# Patient Record
Sex: Female | Born: 2000
Health system: Southern US, Community
[De-identification: ages and names within clinical notes are randomized; demographics above are authoritative.]

## PROBLEM LIST (undated history)

## (undated) DIAGNOSIS — J45909 Unspecified asthma, uncomplicated: Secondary | ICD-10-CM

## (undated) DIAGNOSIS — N390 Urinary tract infection, site not specified: Secondary | ICD-10-CM

## (undated) DIAGNOSIS — N200 Calculus of kidney: Secondary | ICD-10-CM

## (undated) DIAGNOSIS — N12 Tubulo-interstitial nephritis, not specified as acute or chronic: Secondary | ICD-10-CM

## (undated) DIAGNOSIS — F419 Anxiety disorder, unspecified: Secondary | ICD-10-CM

## (undated) DIAGNOSIS — D580 Hereditary spherocytosis: Secondary | ICD-10-CM

## (undated) DIAGNOSIS — K8020 Calculus of gallbladder without cholecystitis without obstruction: Secondary | ICD-10-CM

## (undated) HISTORY — PX: TONSILLECTOMY: SUR1361

## (undated) HISTORY — PX: TYMPANOSTOMY TUBE PLACEMENT: SHX32

## (undated) HISTORY — DX: Calculus of gallbladder without cholecystitis without obstruction: K80.20

## (undated) HISTORY — PX: CHOLECYSTECTOMY: SHX55

## (undated) HISTORY — DX: Anxiety disorder, unspecified: F41.9

## (undated) HISTORY — DX: Unspecified asthma, uncomplicated: J45.909

## (undated) HISTORY — PX: SPLENECTOMY, TOTAL: SHX788

---
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
## 2022-01-04 ENCOUNTER — Encounter: Payer: Self-pay | Admitting: Gastroenterology

## 2022-03-22 ENCOUNTER — Other Ambulatory Visit: Payer: Self-pay | Admitting: Family Medicine

## 2022-03-22 DIAGNOSIS — M25561 Pain in right knee: Secondary | ICD-10-CM

## 2022-04-04 ENCOUNTER — Ambulatory Visit
Admission: RE | Admit: 2022-04-04 | Discharge: 2022-04-04 | Disposition: A | Discharge status: Home / Self Care | Payer: 59 | Source: Ambulatory Visit | Attending: Family Medicine | Admitting: Family Medicine

## 2022-04-04 DIAGNOSIS — M25561 Pain in right knee: Secondary | ICD-10-CM

## 2022-05-07 ENCOUNTER — Ambulatory Visit
Admission: RE | Admit: 2022-05-07 | Discharge: 2022-05-07 | Disposition: A | Discharge status: Home / Self Care | Payer: 59 | Source: Ambulatory Visit | Attending: Family Medicine | Admitting: Family Medicine

## 2022-05-07 ENCOUNTER — Other Ambulatory Visit: Payer: Self-pay | Admitting: Family Medicine

## 2022-05-07 DIAGNOSIS — R109 Unspecified abdominal pain: Secondary | ICD-10-CM

## 2022-05-07 DIAGNOSIS — R300 Dysuria: Secondary | ICD-10-CM

## 2022-05-07 DIAGNOSIS — N200 Calculus of kidney: Secondary | ICD-10-CM

## 2022-05-10 ENCOUNTER — Inpatient Hospital Stay (HOSPITAL_BASED_OUTPATIENT_CLINIC_OR_DEPARTMENT_OTHER)
Admission: EM | Admit: 2022-05-10 | Discharge: 2022-05-12 | DRG: 872 | Disposition: A | Discharge status: Home / Self Care | Payer: 59 | Attending: Internal Medicine | Admitting: Internal Medicine

## 2022-05-10 ENCOUNTER — Encounter (HOSPITAL_BASED_OUTPATIENT_CLINIC_OR_DEPARTMENT_OTHER): Payer: Self-pay

## 2022-05-10 ENCOUNTER — Emergency Department (HOSPITAL_BASED_OUTPATIENT_CLINIC_OR_DEPARTMENT_OTHER): Payer: 59

## 2022-05-10 ENCOUNTER — Other Ambulatory Visit: Payer: Self-pay

## 2022-05-10 DIAGNOSIS — D580 Hereditary spherocytosis: Secondary | ICD-10-CM | POA: Diagnosis present

## 2022-05-10 DIAGNOSIS — Z79899 Other long term (current) drug therapy: Secondary | ICD-10-CM | POA: Diagnosis not present

## 2022-05-10 DIAGNOSIS — Z87442 Personal history of urinary calculi: Secondary | ICD-10-CM | POA: Diagnosis not present

## 2022-05-10 DIAGNOSIS — Z808 Family history of malignant neoplasm of other organs or systems: Secondary | ICD-10-CM

## 2022-05-10 DIAGNOSIS — Z1629 Resistance to other single specified antibiotic: Secondary | ICD-10-CM | POA: Diagnosis present

## 2022-05-10 DIAGNOSIS — R17 Unspecified jaundice: Secondary | ICD-10-CM | POA: Diagnosis present

## 2022-05-10 DIAGNOSIS — Z9081 Acquired absence of spleen: Secondary | ICD-10-CM | POA: Diagnosis not present

## 2022-05-10 DIAGNOSIS — E871 Hypo-osmolality and hyponatremia: Secondary | ICD-10-CM | POA: Diagnosis not present

## 2022-05-10 DIAGNOSIS — K219 Gastro-esophageal reflux disease without esophagitis: Secondary | ICD-10-CM | POA: Diagnosis present

## 2022-05-10 DIAGNOSIS — Z833 Family history of diabetes mellitus: Secondary | ICD-10-CM | POA: Diagnosis not present

## 2022-05-10 DIAGNOSIS — A4151 Sepsis due to Escherichia coli [E. coli]: Secondary | ICD-10-CM | POA: Diagnosis not present

## 2022-05-10 DIAGNOSIS — Z825 Family history of asthma and other chronic lower respiratory diseases: Secondary | ICD-10-CM

## 2022-05-10 DIAGNOSIS — D75839 Thrombocytosis, unspecified: Secondary | ICD-10-CM | POA: Diagnosis not present

## 2022-05-10 DIAGNOSIS — J45909 Unspecified asthma, uncomplicated: Secondary | ICD-10-CM | POA: Diagnosis not present

## 2022-05-10 DIAGNOSIS — E8809 Other disorders of plasma-protein metabolism, not elsewhere classified: Secondary | ICD-10-CM | POA: Diagnosis present

## 2022-05-10 DIAGNOSIS — Z9049 Acquired absence of other specified parts of digestive tract: Secondary | ICD-10-CM | POA: Diagnosis not present

## 2022-05-10 DIAGNOSIS — Z8249 Family history of ischemic heart disease and other diseases of the circulatory system: Secondary | ICD-10-CM | POA: Diagnosis not present

## 2022-05-10 DIAGNOSIS — N10 Acute tubulo-interstitial nephritis: Secondary | ICD-10-CM | POA: Diagnosis present

## 2022-05-10 DIAGNOSIS — E861 Hypovolemia: Secondary | ICD-10-CM | POA: Diagnosis not present

## 2022-05-10 DIAGNOSIS — R319 Hematuria, unspecified: Secondary | ICD-10-CM | POA: Diagnosis present

## 2022-05-10 DIAGNOSIS — E876 Hypokalemia: Secondary | ICD-10-CM | POA: Diagnosis present

## 2022-05-10 DIAGNOSIS — Z885 Allergy status to narcotic agent status: Secondary | ICD-10-CM | POA: Diagnosis not present

## 2022-05-10 DIAGNOSIS — Z803 Family history of malignant neoplasm of breast: Secondary | ICD-10-CM

## 2022-05-10 LAB — URINALYSIS, ROUTINE W REFLEX MICROSCOPIC
Bilirubin Urine: NEGATIVE
Glucose, UA: NEGATIVE mg/dL
Ketones, ur: 40 mg/dL — AB
Nitrite: POSITIVE — AB
Protein, ur: 100 mg/dL — AB
Specific Gravity, Urine: 1.017 (ref 1.005–1.030)
WBC, UA: >50 WBC/hpf — ABNORMAL HIGH (ref 0–5)
pH: 6 (ref 5.0–8.0)

## 2022-05-10 LAB — DIFFERENTIAL
Abs Immature Granulocytes: 0.15 10*3/uL — ABNORMAL HIGH (ref 0.00–0.07)
Basophils Absolute: 0.1 10*3/uL (ref 0.0–0.1)
Basophils Relative: 0 %
Eosinophils Absolute: 0 10*3/uL (ref 0.0–0.5)
Eosinophils Relative: 0 %
Immature Granulocytes: 1 %
Lymphocytes Relative: 5 %
Lymphs Abs: 1.3 10*3/uL (ref 0.7–4.0)
Monocytes Absolute: 2.9 10*3/uL — ABNORMAL HIGH (ref 0.1–1.0)
Monocytes Relative: 11 %
Neutro Abs: 22.1 10*3/uL — ABNORMAL HIGH (ref 1.7–7.7)
Neutrophils Relative %: 83 %
Other: INCREASED %

## 2022-05-10 LAB — CBC
HCT: 38.4 % (ref 36.0–46.0)
Hemoglobin: 14 g/dL (ref 12.0–15.0)
MCH: 32.7 pg (ref 26.0–34.0)
MCHC: 36.5 g/dL — ABNORMAL HIGH (ref 30.0–36.0)
MCV: 89.7 fL (ref 80.0–100.0)
Platelets: 485 10*3/uL — ABNORMAL HIGH (ref 150–400)
RBC: 4.28 MIL/uL (ref 3.87–5.11)
RDW: 12.1 % (ref 11.5–15.5)
WBC: 26 10*3/uL — ABNORMAL HIGH (ref 4.0–10.5)
nRBC: 0 % (ref 0.0–0.2)

## 2022-05-10 LAB — BASIC METABOLIC PANEL
Anion gap: 11 (ref 5–15)
BUN: 11 mg/dL (ref 6–20)
CO2: 24 mmol/L (ref 22–32)
Calcium: 10 mg/dL (ref 8.9–10.3)
Chloride: 96 mmol/L — ABNORMAL LOW (ref 98–111)
Creatinine, Ser: 0.9 mg/dL (ref 0.44–1.00)
GFR, Estimated: >60 mL/min (ref >60)
Glucose, Bld: 88 mg/dL (ref 70–99)
Potassium: 3.2 mmol/L — ABNORMAL LOW (ref 3.5–5.1)
Sodium: 131 mmol/L — ABNORMAL LOW (ref 135–145)

## 2022-05-10 LAB — PREGNANCY, URINE: Preg Test, Ur: NEGATIVE

## 2022-05-10 LAB — LACTIC ACID, PLASMA: Lactic Acid, Venous: 0.7 mmol/L (ref 0.5–1.9)

## 2022-05-10 MED ORDER — POTASSIUM CHLORIDE IN NACL 40-0.9 MEQ/L-% IV SOLN
INTRAVENOUS | Status: DC
  Filled 2022-05-10: qty 1000

## 2022-05-10 MED ORDER — SODIUM CHLORIDE 0.9 % IV SOLN
1.0000 g | Freq: Once | INTRAVENOUS | Status: AC
  Administered 2022-05-10: 1 g via INTRAVENOUS
  Filled 2022-05-10: qty 10

## 2022-05-10 MED ORDER — LACTATED RINGERS IV BOLUS
1000.0000 mL | Freq: Once | INTRAVENOUS | Status: AC
  Administered 2022-05-10: 1000 mL via INTRAVENOUS

## 2022-05-10 MED ORDER — MELATONIN 3 MG PO TABS
3.0000 mg | ORAL_TABLET | Freq: Every evening | ORAL | Status: DC | PRN
Start: 2022-05-10 — End: 2022-05-12

## 2022-05-10 MED ORDER — ENOXAPARIN SODIUM 40 MG/0.4ML IJ SOSY
40.0000 mg | PREFILLED_SYRINGE | Freq: Every day | INTRAMUSCULAR | Status: DC
  Filled 2022-05-10: qty 0.4

## 2022-05-10 MED ORDER — HYDROMORPHONE HCL 1 MG/ML IJ SOLN: 0.5000 mg | INTRAMUSCULAR | Status: DC | PRN

## 2022-05-10 MED ORDER — SODIUM CHLORIDE 0.9 % IV SOLN
2.0000 g | INTRAVENOUS | Status: DC
  Administered 2022-05-11 – 2022-05-12 (×2): 2 g via INTRAVENOUS
  Filled 2022-05-10 (×2): qty 20

## 2022-05-10 MED ORDER — POLYETHYLENE GLYCOL 3350 17 G PO PACK
17.0000 g | PACK | Freq: Every day | ORAL | Status: DC | PRN
  Administered 2022-05-11: 17 g via ORAL
  Filled 2022-05-10: qty 1

## 2022-05-10 MED ORDER — ACETAMINOPHEN 500 MG PO TABS
1000.0000 mg | ORAL_TABLET | Freq: Once | ORAL | Status: AC
  Administered 2022-05-10: 1000 mg via ORAL
  Filled 2022-05-10: qty 2

## 2022-05-10 MED ORDER — PROCHLORPERAZINE EDISYLATE 10 MG/2ML IJ SOLN
10.0000 mg | Freq: Four times a day (QID) | INTRAMUSCULAR | Status: DC | PRN
  Administered 2022-05-10 – 2022-05-11 (×2): 10 mg via INTRAVENOUS
  Filled 2022-05-10 (×2): qty 2

## 2022-05-10 MED ORDER — KETOROLAC TROMETHAMINE 15 MG/ML IJ SOLN
15.0000 mg | Freq: Four times a day (QID) | INTRAMUSCULAR | Status: AC | PRN
  Administered 2022-05-10 – 2022-05-11 (×3): 15 mg via INTRAVENOUS
  Filled 2022-05-10 (×3): qty 1

## 2022-05-10 MED ORDER — ACETAMINOPHEN 325 MG PO TABS
650.0000 mg | ORAL_TABLET | Freq: Four times a day (QID) | ORAL | Status: DC | PRN
  Administered 2022-05-10 – 2022-05-11 (×2): 650 mg via ORAL
  Filled 2022-05-10 (×2): qty 2

## 2022-05-10 MED ORDER — OXYCODONE HCL 5 MG PO TABS: 5.0000 mg | ORAL_TABLET | Freq: Four times a day (QID) | ORAL | Status: DC | PRN

## 2022-05-10 MED ORDER — ACETAMINOPHEN 500 MG PO TABS
1000.0000 mg | ORAL_TABLET | Freq: Four times a day (QID) | ORAL | Status: DC | PRN
  Filled 2022-05-10: qty 2

## 2022-05-10 MED ORDER — SENNOSIDES-DOCUSATE SODIUM 8.6-50 MG PO TABS
2.0000 | ORAL_TABLET | Freq: Every day | ORAL | Status: DC
  Filled 2022-05-10: qty 2

## 2022-05-10 MED ORDER — ONDANSETRON HCL 4 MG/2ML IJ SOLN
4.0000 mg | Freq: Once | INTRAMUSCULAR | Status: AC
  Administered 2022-05-10: 4 mg via INTRAVENOUS
  Filled 2022-05-10: qty 2

## 2022-05-10 MED ORDER — KETOROLAC TROMETHAMINE 15 MG/ML IJ SOLN: 15.0000 mg | Freq: Four times a day (QID) | INTRAMUSCULAR | Status: DC | PRN

## 2022-05-10 MED ORDER — KETOROLAC TROMETHAMINE 30 MG/ML IJ SOLN
30.0000 mg | Freq: Once | INTRAMUSCULAR | Status: AC
  Administered 2022-05-10: 30 mg via INTRAVENOUS
  Filled 2022-05-10: qty 1

## 2022-05-10 NOTE — ED Triage Notes (Signed)
Patient here POV from Home.  Endorses having Renal Stones that were present Friday and were passed Saturday. Seen by PCP on Monday and had CT Scan completed which displayed Inflammation of Ureters.   Fever Intermittently. Bilateral Flank Pain. Moderate Nausea. No Emesis. No Diarrhea. Moderate Constipation. 102 Fever today. Tylenol 2 Hours PTA.  Originally finished Bactrim Course and has been taking Levaquin for 2 Days.   NAD Noted during Triage. A&Ox4. GCS 15. Ambulatory.

## 2022-05-10 NOTE — ED Notes (Signed)
Retook pt's temp. Temp was 102.0. Pt also stated that she is in pain. Informed Dr. Dalene Seltzer.

## 2022-05-10 NOTE — ED Notes (Signed)
US at bedside

## 2022-05-10 NOTE — H&P (Addendum)
History and Physical  Andrea Gaines IRJ:[id number] DOB: May 08, 2001 DOA: 05/10/2022  Referring physician: Accepted as a direct admit by Dr. Hanley Ben, [facility], hospitalist service. PCP: Lavada Mesi, MD  Outpatient Specialists: None. Patient coming from: Home.  Chief Complaint: Hematuria.  HPI: Andrea Gaines is a 21 y.o. female with medical history significant for GERD, prior splenectomy, who presented to [facility] ED with complaints of hematuria and bilateral flank pain with concern for bilateral pyelonephritis.  The patient was recently treated outpatient for urinary tract infection and recently passed 3 renal stones the weekend of 6/23-6/25.  She had a noncontrast CT scan done on 05/07/2022 which showed mild dilatation of the right renal pelvis without obstructing stone identified.  This could be due to recently passed stone.    Upon presentation to [facility] ED, work-up revealed UA positive for pyuria, leukocytosis 26 K and tachycardia 109.  Cultures were obtained and the patient was started on empiric IV antibiotics Rocephin in the ED.  Additionally received 1 L IV fluid bolus LR x1, IV antiemetics Zofran 4 mg x 1, 30 mg IV Toradol x1 and 1 g p.o. Tylenol x1.  [facility], hospitalist service, was asked to admit.  The patient was accepted and admitted by Dr. Hanley Ben, [facility], hospitalist service, as observation status to [facility] long hospital telemetry unit.  At the time of this visit, the patient reports no recurrence of hematuria the last 2 times she voided.    Renal ultrasound done on 05/10/2022 was nonacute and benign.  No evidence of hydronephrosis or masses.  Lactic acid negative at 0.7.  ED Course: Tmax 99.2.  BP 104/50, pulse 72, respiratory rate 19, O2 saturation 98% on room air.  Lab studies remarkable for WBC 26,000, platelet count 45, neutrophil count 22.1.  Serum sodium 131, potassium 3.2, chloride 96.  Lactic acid 0.7.  Review of Systems: Review of systems as noted in the HPI. All other  systems reviewed and are negative.   Past Medical History:  Diagnosis Date   Anxiety    Asthma    Gallstones    Hereditary spherocytosis (HCC)    Past Surgical History:  Procedure Laterality Date   CHOLECYSTECTOMY     SPLENECTOMY, TOTAL     TONSILLECTOMY     TYMPANOSTOMY TUBE PLACEMENT      Social History:  reports that she has never smoked. She has never used smokeless tobacco. She reports current alcohol use. She reports that she does not use drugs.   Allergies  Allergen Reactions   Morphine Palpitations    Made Hot and pains in back   Augmentin [Amoxicillin-Pot Clavulanate] Diarrhea    Family History  Problem Relation Age of Onset   Hypertension Mother    Hereditary spherocytosis Mother    Hiatal hernia Mother    Hereditary spherocytosis Sister    Asthma Sister    Asthma Brother    Kidney Stones Brother    Heart disease Maternal Grandmother    Aneurysm Maternal Grandmother        brain   Diabetes Maternal Grandmother    Hypertension Maternal Grandmother    Heart disease Maternal Grandfather    Hypertension Maternal Grandfather    Brain cancer Paternal Grandmother    Prostate cancer Paternal Grandfather    Testicular cancer Maternal Uncle    Breast cancer Other        Maternal great aunt      Prior to Admission medications   Medication Sig Start Date End Date Taking? Authorizing Provider  CARAFATE 1 g tablet Take 1 g by mouth 4 (four) times daily. 12/14/21   [provider]  celecoxib (CELEBREX) 100 MG capsule Take 100 mg by mouth daily.    [provider]  multivitamin (VIT W/EXTRA C) CHEW chewable tablet Chew 1 tablet by mouth daily.    [provider]  TRI-LO-ESTARYLLA 0.18/0.215/0.25 MG-25 MCG tab Take 1 tablet by mouth daily. 11/27/21   [provider]    Physical Exam: BP (!) 104/50 (BP Location: Right Arm)   Pulse 72   Temp 98.4 F (36.9 C) (Oral)   Resp 19   Ht 5' 7.75" (1.721 m)   Wt 74.4 kg   SpO2 98%    BMI 25.12 kg/m   General: 21 y.o. year-old female well developed well nourished in no acute distress.  Alert and oriented x3. Cardiovascular: Regular rate and rhythm with no rubs or gallops.  No thyromegaly or JVD noted.  No lower extremity edema. 2/4 pulses in all 4 extremities. Respiratory: Clear to auscultation with no wheezes or rales. Good inspiratory effort. Abdomen: Soft nontender nondistended with normal bowel sounds x4 quadrants.  Bilateral flank pain. Muskuloskeletal: No cyanosis, clubbing or edema noted bilaterally Neuro: CN II-XII intact, strength, sensation, reflexes Skin: No ulcerative lesions noted or rashes Psychiatry: Judgement and insight appear normal. Mood is appropriate for condition and setting          Labs on Admission:  Basic Metabolic Panel: Recent Labs  Lab 05/10/22 1441  NA 131*  K 3.2*  CL 96*  CO2 24  GLUCOSE 88  BUN 11  CREATININE 0.90  CALCIUM 10.0   Liver Function Tests: No results for input(s): "AST", "ALT", "ALKPHOS", "BILITOT", "PROT", "ALBUMIN" in the last 168 hours. No results for input(s): "LIPASE", "AMYLASE" in the last 168 hours. No results for input(s): "AMMONIA" in the last 168 hours. CBC: Recent Labs  Lab 05/10/22 1441  WBC 26.0*  NEUTROABS 22.1*  HGB 14.0  HCT 38.4  MCV 89.7  PLT 485*   Cardiac Enzymes: No results for input(s): "CKTOTAL", "CKMB", "CKMBINDEX", "TROPONINI" in the last 168 hours.  BNP (last 3 results) No results for input(s): "BNP" in the last 8760 hours.  ProBNP (last 3 results) No results for input(s): "PROBNP" in the last 8760 hours.  CBG: No results for input(s): "GLUCAP" in the last 168 hours.  Radiological Exams on Admission: US Renal  Result Date: 05/10/2022 CLINICAL DATA:  Fever, flank pain, and hematuria. Passed kidney stones five days ago. EXAM: RENAL / URINARY TRACT ULTRASOUND COMPLETE COMPARISON:  CT abdomen pelvis dated May 07, 2022. FINDINGS: Right Kidney: Renal measurements: 11.7 x 4.1  x 5.3 cm = volume: 131 mL. Echogenicity within normal limits. No mass or hydronephrosis visualized. Left Kidney: Renal measurements: 11.7 x 5.4 x 4.5 cm = volume: 147 mL. Echogenicity within normal limits. No mass or hydronephrosis visualized. Bladder: Appears normal for degree of bladder distention. Other: Unchanged trace free fluid in the pelvis, likely physiologic. IMPRESSION: 1. Normal renal ultrasound. Electronically Signed   By: Obie Dredge M.D.   On: 05/10/2022 18:04    EKG: I independently viewed the EKG done and my findings are as followed: None available at the time of the visit.  Assessment/Plan Present on Admission:  Acute pyelonephritis  Principal Problem:   Acute pyelonephritis  Sepsis secondary to UTI, presumed bilateral pyelonephritis, POA Presented with hematuria, bilateral flank pain UA positive for pyuria, leukocytosis WBC 26,000, tachycardia pulse 109. Cultures obtained Follow blood cultures x2  peripherally Follow urine culture Narrow down antibiotics as indicated with ID and sensitivities. Normal saline KCl at 75 cc/h x 2 days Maintain MAP greater than 65. Monitor WBC and fever curve Closely monitor vital signs. Pain control and bowel regimen  Hematuria likely multifactorial Secondary to passing kidney stones and from UTI IV fluid hydration Treat underlying conditions Monitor H&H  Hypovolemic hyponatremia Presented with serum sodium 131 IV fluid hydration NS KCl 40 mEq at 75 cc/h x 2 days Monitor urine output  Hypokalemia Serum potassium 3.2 Repleted intravenously with IV fluid Obtain magnesium level in the morning Repeat chemistry panel in the morning    DVT prophylaxis: SCDs  Code Status: Full code  Family Communication: Updated her mother at bedside  Disposition Plan: Accepted and admitted by Dr. Hanley Ben, [facility]  Consults called: Urology consulted by EDP.  Admission status: Observation status.  However the patient will likely require more  than 2 midnights for further evaluation and treatment of present condition.   Status is: Observation    Darlin Drop MD [facility] Pager [phone]  If 7PM-7AM, please contact night-coverage [email or web address] Password [facility]  05/10/2022, 7:38 PM

## 2022-05-10 NOTE — ED Notes (Signed)
Report given to floor/Pt's RN.

## 2022-05-10 NOTE — ED Provider Notes (Addendum)
[facility] EMERGENCY DEPT Provider Note   CSN: [id number] Arrival date & time: 05/10/22  1410     History  Chief Complaint  Patient presents with   Hematuria    Andrea Gaines is a 21 y.o. female.  HPI    1 week of bactrim for UTI, finished SUnday, Friday began to have kidney stone like pain Passed 3 kidney stones Saturday then felt better Pain came back late midnight Sunday night/Monday morning Pain Friday Was on bactrim for UTI last week  CT on Monday, toradol, Tuesday night had fever 102, Wednesday ok, had fever in AM. Fever began again Thursday, pain back On levaquin for 2 days Urine culture yesterday AM Today pain returned, is present on both sides Nausea, not eating, has not had BM. Does not feel constipated.  Passing flatus.  Threw up Monday, a few times Tuesday  No dysuria now  Past Medical History:  Diagnosis Date   Anxiety    Asthma    Gallstones    Hereditary spherocytosis (HCC)     Past Surgical History:  Procedure Laterality Date   CHOLECYSTECTOMY     SPLENECTOMY, TOTAL     TONSILLECTOMY     TYMPANOSTOMY TUBE PLACEMENT      Home Medications Prior to Admission medications   Medication Sig Start Date End Date Taking? Authorizing Provider  acetaminophen (TYLENOL) 500 MG tablet Take 1,000 mg by mouth every 6 (six) hours as needed.   Yes [provider]  busPIRone (BUSPAR) 7.5 MG tablet Take 7.5 mg by mouth daily as needed (anxiety). 12/15/21  Yes [provider]  celecoxib (CELEBREX) 100 MG capsule Take 100 mg by mouth daily as needed for moderate pain.   Yes [provider]  FLOMAX 0.4 MG CAPS capsule Take 0.4 mg by mouth daily. 05/07/22  Yes [provider]  levETIRAcetam (KEPPRA) 250 MG tablet Take 250 mg by mouth daily. 05/09/22  Yes [provider]  ondansetron (ZOFRAN) 4 MG tablet Take 4-8 mg by mouth every 6 (six) hours as needed for nausea or vomiting. 05/07/22  Yes [provider]  traMADol (ULTRAM) 50 MG tablet Take 50-100 mg by mouth every 6 (six) hours as needed for severe pain. 05/07/22  Yes [provider]  TRI-SPRINTEC 0.18/0.215/0.25 MG-35 MCG tablet Take 1 tablet by mouth daily. 02/27/22  Yes [provider]  levofloxacin (LEVAQUIN) 250 MG tablet Take by mouth daily. 05/09/22   [provider]      Allergies    Morphine and Augmentin [amoxicillin-pot clavulanate]    Review of Systems   Review of Systems  Physical Exam Updated Vital Signs BP 119/64 (BP Location: Right Arm)   Pulse (!) 102   Temp 98.2 F (36.8 C) (Oral)   Resp (!) 21   Ht 5' 7.75" (1.721 m)   Wt 74.4 kg   SpO2 98%   BMI 25.12 kg/m  Physical Exam Vitals and nursing note reviewed.  Constitutional:      General: She is not in acute distress.    Appearance: Normal appearance. She is not ill-appearing, toxic-appearing or diaphoretic.  HENT:     Head: Normocephalic.  Eyes:     Conjunctiva/sclera: Conjunctivae normal.  Cardiovascular:     Rate and Rhythm: Regular rhythm. Tachycardia present.     Pulses: Normal pulses.  Pulmonary:     Effort: Pulmonary effort is normal. No respiratory distress.  Abdominal:     Tenderness: There is right CVA tenderness and left CVA  tenderness.  Musculoskeletal:        General: No deformity or signs of injury.     Cervical back: No rigidity.  Skin:    General: Skin is warm and dry.     Coloration: Skin is not jaundiced or pale.  Neurological:     General: No focal deficit present.     Mental Status: She is alert and oriented to person, place, and time.     ED Results / Procedures / Treatments   Labs (all labs ordered are listed, but only abnormal results are displayed) Labs Reviewed  URINALYSIS, ROUTINE W REFLEX MICROSCOPIC - Abnormal; Notable for the following components:      Result Value   APPearance HAZY (*)    Hgb urine dipstick MODERATE (*)    Ketones, ur 40 (*)    Protein, ur 100 (*)    Nitrite  POSITIVE (*)    Leukocytes,Ua LARGE (*)    WBC, UA >50 (*)    Bacteria, UA FEW (*)    Non Squamous Epithelial 0-5 (*)    All other components within normal limits  BASIC METABOLIC PANEL - Abnormal; Notable for the following components:   Sodium 131 (*)    Potassium 3.2 (*)    Chloride 96 (*)    All other components within normal limits  CBC - Abnormal; Notable for the following components:   WBC 26.0 (*)    MCHC 36.5 (*)    Platelets 485 (*)    All other components within normal limits  DIFFERENTIAL - Abnormal; Notable for the following components:   Neutro Abs 22.1 (*)    Monocytes Absolute 2.9 (*)    Abs Immature Granulocytes 0.15 (*)    All other components within normal limits  CBC WITH DIFFERENTIAL/PLATELET - Abnormal; Notable for the following components:   WBC 29.1 (*)    RBC 3.83 (*)    HCT 35.2 (*)    MCHC 36.1 (*)    Platelets 448 (*)    Neutro Abs 23.2 (*)    Monocytes Absolute 4.4 (*)    Abs Immature Granulocytes 0.31 (*)    All other components within normal limits  COMPREHENSIVE METABOLIC PANEL - Abnormal; Notable for the following components:   Potassium 3.4 (*)    Glucose, Bld 109 (*)    Calcium 8.7 (*)    Total Protein 6.3 (*)    Albumin 3.2 (*)    Total Bilirubin 2.0 (*)    All other components within normal limits  CULTURE, BLOOD (ROUTINE X 2)  CULTURE, BLOOD (ROUTINE X 2)  URINE CULTURE  PREGNANCY, URINE  LACTIC ACID, PLASMA  MAGNESIUM  PHOSPHORUS  HIV ANTIBODY (ROUTINE TESTING W REFLEX)    EKG None  Radiology US Renal  Result Date: 05/10/2022 CLINICAL DATA:  Fever, flank pain, and hematuria. Passed kidney stones five days ago. EXAM: RENAL / URINARY TRACT ULTRASOUND COMPLETE COMPARISON:  CT abdomen pelvis dated May 07, 2022. FINDINGS: Right Kidney: Renal measurements: 11.7 x 4.1 x 5.3 cm = volume: 131 mL. Echogenicity within normal limits. No mass or hydronephrosis visualized. Left Kidney: Renal measurements: 11.7 x 5.4 x 4.5 cm = volume:  147 mL. Echogenicity within normal limits. No mass or hydronephrosis visualized. Bladder: Appears normal for degree of bladder distention. Other: Unchanged trace free fluid in the pelvis, likely physiologic. IMPRESSION: 1. Normal renal ultrasound. Electronically Signed   By: Obie Dredge M.D.   On: 05/10/2022 18:04    Procedures Procedures  Medications Ordered in ED Medications  acetaminophen (TYLENOL) tablet 650 mg (650 mg Oral Given 05/10/22 2229)  polyethylene glycol (MIRALAX / GLYCOLAX) packet 17 g (has no administration in time range)  cefTRIAXone (ROCEPHIN) 2 g in sodium chloride 0.9 % 100 mL IVPB (2 g Intravenous New Bag/Given 05/11/22 1029)  prochlorperazine (COMPAZINE) injection 10 mg (10 mg Intravenous Given 05/11/22 0753)  acetaminophen (TYLENOL) tablet 1,000 mg (has no administration in time range)  ketorolac (TORADOL) 15 MG/ML injection 15 mg (15 mg Intravenous Given 05/11/22 0749)  melatonin tablet 3 mg (has no administration in time range)  potassium chloride SA (KLOR-CON M) CR tablet 40 mEq (40 mEq Oral Given 05/11/22 1029)  0.9 %  sodium chloride infusion ( Intravenous New Bag/Given 05/11/22 1026)  lactated ringers bolus 1,000 mL (has no administration in time range)  cefTRIAXone (ROCEPHIN) 1 g in sodium chloride 0.9 % 100 mL IVPB (0 g Intravenous Stopped 05/10/22 1736)  lactated ringers bolus 1,000 mL ( Intravenous Stopped 05/10/22 1840)  acetaminophen (TYLENOL) tablet 1,000 mg (1,000 mg Oral Given 05/10/22 1524)  ketorolac (TORADOL) 30 MG/ML injection 30 mg (30 mg Intravenous Given 05/10/22 1625)  ondansetron (ZOFRAN) injection 4 mg (4 mg Intravenous Given 05/10/22 1623)  magnesium oxide (MAG-OX) tablet 800 mg (800 mg Oral Given 05/11/22 1029)    ED Course/ Medical Decision Making/ A&P                           Medical Decision Making Amount and/or Complexity of Data Reviewed Labs: ordered. Radiology: ordered.  Risk OTC drugs. Prescription drug management. Decision  regarding hospitalization.   21yo female with history of hereditary spherocytosis, splenectomy, presents with concern for flank pain, fever in setting of recent bactrim for UTI one week ago, passed kidney stone 5days ago, levaquin for 2 days.   Had CT completed on Monday consistent with mildly dilated renal pelvis without signs of continued stone, consistent with recently passed stone.  Discussed with urology given this finding with symptoms, will obtain renal US, can consult urology as needed however do not suspect need for surgical intervention and findinsg likely represent passed stone rather than ureteral stricture.  Labs significant for WBC 26000, UA consistent with infection. Temperature up to 102.  History and exam consistent with pyelonephritis with concern for failure of outpatient treatment. Will give IV abx, admit for further care.  Blood cx sent. Renal US without signs of abscess.         Final Clinical Impression(s) / ED Diagnoses Final diagnoses:  Acute pyelonephritis    Rx / DC Orders ED Discharge Orders     None         Alvira Monday, MD 05/11/22 1133    Alvira Monday, MD 06/01/22 2128

## 2022-05-11 DIAGNOSIS — Z833 Family history of diabetes mellitus: Secondary | ICD-10-CM | POA: Diagnosis not present

## 2022-05-11 DIAGNOSIS — Z9081 Acquired absence of spleen: Secondary | ICD-10-CM | POA: Diagnosis not present

## 2022-05-11 DIAGNOSIS — E876 Hypokalemia: Secondary | ICD-10-CM | POA: Diagnosis present

## 2022-05-11 DIAGNOSIS — A4151 Sepsis due to Escherichia coli [E. coli]: Secondary | ICD-10-CM | POA: Diagnosis present

## 2022-05-11 DIAGNOSIS — E861 Hypovolemia: Secondary | ICD-10-CM | POA: Diagnosis present

## 2022-05-11 DIAGNOSIS — Z9049 Acquired absence of other specified parts of digestive tract: Secondary | ICD-10-CM | POA: Diagnosis not present

## 2022-05-11 DIAGNOSIS — J45909 Unspecified asthma, uncomplicated: Secondary | ICD-10-CM | POA: Diagnosis present

## 2022-05-11 DIAGNOSIS — N390 Urinary tract infection, site not specified: Secondary | ICD-10-CM | POA: Diagnosis not present

## 2022-05-11 DIAGNOSIS — N10 Acute tubulo-interstitial nephritis: Secondary | ICD-10-CM | POA: Diagnosis present

## 2022-05-11 DIAGNOSIS — D75839 Thrombocytosis, unspecified: Secondary | ICD-10-CM | POA: Diagnosis present

## 2022-05-11 DIAGNOSIS — D72829 Elevated white blood cell count, unspecified: Secondary | ICD-10-CM | POA: Diagnosis not present

## 2022-05-11 DIAGNOSIS — E871 Hypo-osmolality and hyponatremia: Secondary | ICD-10-CM

## 2022-05-11 DIAGNOSIS — Z803 Family history of malignant neoplasm of breast: Secondary | ICD-10-CM | POA: Diagnosis not present

## 2022-05-11 DIAGNOSIS — K219 Gastro-esophageal reflux disease without esophagitis: Secondary | ICD-10-CM | POA: Diagnosis present

## 2022-05-11 DIAGNOSIS — R17 Unspecified jaundice: Secondary | ICD-10-CM | POA: Diagnosis present

## 2022-05-11 DIAGNOSIS — D580 Hereditary spherocytosis: Secondary | ICD-10-CM | POA: Diagnosis present

## 2022-05-11 DIAGNOSIS — Z885 Allergy status to narcotic agent status: Secondary | ICD-10-CM | POA: Diagnosis not present

## 2022-05-11 DIAGNOSIS — E8809 Other disorders of plasma-protein metabolism, not elsewhere classified: Secondary | ICD-10-CM | POA: Diagnosis present

## 2022-05-11 DIAGNOSIS — R319 Hematuria, unspecified: Secondary | ICD-10-CM | POA: Diagnosis present

## 2022-05-11 DIAGNOSIS — Z825 Family history of asthma and other chronic lower respiratory diseases: Secondary | ICD-10-CM | POA: Diagnosis not present

## 2022-05-11 DIAGNOSIS — Z87442 Personal history of urinary calculi: Secondary | ICD-10-CM | POA: Diagnosis not present

## 2022-05-11 DIAGNOSIS — Z8249 Family history of ischemic heart disease and other diseases of the circulatory system: Secondary | ICD-10-CM | POA: Diagnosis not present

## 2022-05-11 DIAGNOSIS — Z79899 Other long term (current) drug therapy: Secondary | ICD-10-CM | POA: Diagnosis not present

## 2022-05-11 DIAGNOSIS — Z808 Family history of malignant neoplasm of other organs or systems: Secondary | ICD-10-CM | POA: Diagnosis not present

## 2022-05-11 DIAGNOSIS — Z1629 Resistance to other single specified antibiotic: Secondary | ICD-10-CM | POA: Diagnosis present

## 2022-05-11 LAB — CBC WITH DIFFERENTIAL/PLATELET
Abs Immature Granulocytes: 0.31 10*3/uL — ABNORMAL HIGH (ref 0.00–0.07)
Basophils Absolute: 0.1 10*3/uL (ref 0.0–0.1)
Basophils Relative: 0 %
Eosinophils Absolute: 0 10*3/uL (ref 0.0–0.5)
Eosinophils Relative: 0 %
HCT: 35.2 % — ABNORMAL LOW (ref 36.0–46.0)
Hemoglobin: 12.7 g/dL (ref 12.0–15.0)
Immature Granulocytes: 1 %
Lymphocytes Relative: 4 %
Lymphs Abs: 1.1 10*3/uL (ref 0.7–4.0)
MCH: 33.2 pg (ref 26.0–34.0)
MCHC: 36.1 g/dL — ABNORMAL HIGH (ref 30.0–36.0)
MCV: 91.9 fL (ref 80.0–100.0)
Monocytes Absolute: 4.4 10*3/uL — ABNORMAL HIGH (ref 0.1–1.0)
Monocytes Relative: 15 %
Neutro Abs: 23.2 10*3/uL — ABNORMAL HIGH (ref 1.7–7.7)
Neutrophils Relative %: 80 %
Platelets: 448 10*3/uL — ABNORMAL HIGH (ref 150–400)
RBC: 3.83 MIL/uL — ABNORMAL LOW (ref 3.87–5.11)
RDW: 12.5 % (ref 11.5–15.5)
WBC: 29.1 10*3/uL — ABNORMAL HIGH (ref 4.0–10.5)
nRBC: 0 % (ref 0.0–0.2)

## 2022-05-11 LAB — COMPREHENSIVE METABOLIC PANEL
ALT: 24 U/L (ref 0–44)
AST: 18 U/L (ref 15–41)
Albumin: 3.2 g/dL — ABNORMAL LOW (ref 3.5–5.0)
Alkaline Phosphatase: 75 U/L (ref 38–126)
Anion gap: 8 (ref 5–15)
BUN: 9 mg/dL (ref 6–20)
CO2: 22 mmol/L (ref 22–32)
Calcium: 8.7 mg/dL — ABNORMAL LOW (ref 8.9–10.3)
Chloride: 108 mmol/L (ref 98–111)
Creatinine, Ser: 0.74 mg/dL (ref 0.44–1.00)
GFR, Estimated: >60 mL/min (ref >60)
Glucose, Bld: 109 mg/dL — ABNORMAL HIGH (ref 70–99)
Potassium: 3.4 mmol/L — ABNORMAL LOW (ref 3.5–5.1)
Sodium: 138 mmol/L (ref 135–145)
Total Bilirubin: 2 mg/dL — ABNORMAL HIGH (ref 0.3–1.2)
Total Protein: 6.3 g/dL — ABNORMAL LOW (ref 6.5–8.1)

## 2022-05-11 LAB — MAGNESIUM: Magnesium: 1.7 mg/dL (ref 1.7–2.4)

## 2022-05-11 LAB — HIV ANTIBODY (ROUTINE TESTING W REFLEX): HIV Screen 4th Generation wRfx: NONREACTIVE

## 2022-05-11 LAB — PHOSPHORUS: Phosphorus: 2.6 mg/dL (ref 2.5–4.6)

## 2022-05-11 MED ORDER — SODIUM CHLORIDE 0.9 % IV SOLN: INTRAVENOUS | Status: DC

## 2022-05-11 MED ORDER — LACTATED RINGERS IV BOLUS
1000.0000 mL | Freq: Once | INTRAVENOUS | Status: AC
  Administered 2022-05-11: 1000 mL via INTRAVENOUS

## 2022-05-11 MED ORDER — POTASSIUM CHLORIDE CRYS ER 20 MEQ PO TBCR
40.0000 meq | EXTENDED_RELEASE_TABLET | Freq: Two times a day (BID) | ORAL | Status: AC
Start: 2022-05-11 — End: 2022-05-11
  Administered 2022-05-11 (×2): 40 meq via ORAL
  Filled 2022-05-11 (×2): qty 2

## 2022-05-11 MED ORDER — KETOROLAC TROMETHAMINE 15 MG/ML IJ SOLN
15.0000 mg | Freq: Four times a day (QID) | INTRAMUSCULAR | Status: DC | PRN
  Administered 2022-05-11: 15 mg via INTRAVENOUS
  Filled 2022-05-11: qty 1

## 2022-05-11 MED ORDER — MAGNESIUM SULFATE 2 GM/50ML IV SOLN: 2.0000 g | Freq: Once | INTRAVENOUS | Status: DC

## 2022-05-11 MED ORDER — ONDANSETRON HCL 4 MG/2ML IJ SOLN
4.0000 mg | Freq: Four times a day (QID) | INTRAMUSCULAR | Status: DC | PRN
  Administered 2022-05-11 (×2): 4 mg via INTRAVENOUS
  Filled 2022-05-11 (×2): qty 2

## 2022-05-11 MED ORDER — ORAL CARE MOUTH RINSE: 15.0000 mL | OROMUCOSAL | Status: DC | PRN

## 2022-05-11 MED ORDER — MAGNESIUM OXIDE -MG SUPPLEMENT 400 (240 MG) MG PO TABS
800.0000 mg | ORAL_TABLET | Freq: Once | ORAL | Status: AC
Start: 2022-05-11 — End: 2022-05-11
  Administered 2022-05-11: 800 mg via ORAL
  Filled 2022-05-11: qty 2

## 2022-05-11 NOTE — TOC Progression Note (Signed)
Transition of Care [facility]) - Progression Note    Patient Details  Name: Andrea Gaines MRN: [id number] Date of Birth: 2001/01/03  Transition of Care [facility]) CM/SW Contact  Geni Bers, RN Phone Number: 05/11/2022, 11:08 AM  Clinical Narrative:     Transition of Care (TOC) Screening Note   Patient Details  Name: Andrea Gaines Date of Birth: Apr 11, 2001   Transition of Care [facility]) CM/SW Contact:    Geni Bers, RN Phone Number: 05/11/2022, 11:08 AM    Transition of Care Department (TOC) has reviewed patient and no TOC needs have been identified at this time. We will continue to monitor patient advancement through interdisciplinary progression rounds. If new patient transition needs arise, please place a TOC consult.          Expected Discharge Plan and Services                                                 Social Determinants of Health (SDOH) Interventions    Readmission Risk Interventions     No data to display

## 2022-05-11 NOTE — Hospital Course (Signed)
Patient is a 21 year old female with a past medical history significant for but not limited to GERD, history of prior splenectomy as well as other comorbidities who presented to drop urgent ED with complaints of hematuria and bilateral flank pain with concern for bilateral pyelonephritis.  She was recently treated in outpatient setting for urinary tract infection and recently passed renal stones a week of 6/23-6/25 and she had a noncontrast CT scan on 05/07/2022 which showed mild dilation of the right renal pelvis with out obstructing stone identified and this was likely due to a recently passed stone.  Upon presentation to the droppage emergency room work-up revealed a UA that was positive for pyuria, leukocytosis of 26,000 and tachycardia.  Cultures were obtained and patient was started empiric antibiotics with IV Rocephin.  Initially she received a liter IV fluid bolus of LR and was given antiemetics and Toradol as well as acetaminophen.  [facility] admit this patient and she was placed on telemetry unit.  Renal ultrasound done on 05/10/2022 was normal and showed no evidence of hydronephrosis bilaterally. WBC has slightly worsened.

## 2022-05-11 NOTE — TOC Progression Note (Deleted)
Transition of Care [facility]) - Progression Note    Patient Details  Name: Andrea Gaines MRN: [id number] Date of Birth: 31-Dec-2000  Transition of Care [facility]) CM/SW Contact  Geni Bers, RN Phone Number: 05/11/2022, 11:10 AM  Clinical Narrative:       Transition of Care (TOC) Screening Note   Patient Details  Name: Andrea Gaines Date of Birth: 05/21/2001   Transition of Care [facility]) CM/SW Contact:    Geni Bers, RN Phone Number: 05/11/2022, 11:10 AM    Transition of Care Department (TOC) has reviewed patient and no TOC needs have been identified at this time. We will continue to monitor patient advancement through interdisciplinary progression rounds. If new patient transition needs arise, please place a TOC consult.        Expected Discharge Plan and Services                                                 Social Determinants of Health (SDOH) Interventions    Readmission Risk Interventions     No data to display

## 2022-05-11 NOTE — Consult Note (Signed)
Urology Consult  Referring physician: Dr. Marland McalpineSheikh Reason for referral: right pyelonephritis  Chief Complaint: right flank pain  History of Present Illness: Ms Andrea Gaines is a 21yo admitted overnight with fever and right flank pain. She passed 3 small calculi last week and was doing well until Monday when her right flank pain returned. She underwent CT on 6/26 which showed mild right renal pelvis dilation without definitive calculus. She was treated initially with levaquin but then developed fevers to 102 yesterday and presented to the ER. Renal US yesterday showed resolution of the right hydronephrosis. Urine culture is currently growing E coli. She had a fever of 100 overnight. She is currently on rocephin. Her right flank pain is dull constant mild and nonraditing. She denies nausea or vomiting. She denies any significant LUTS.   Past Medical History:  Diagnosis Date   Anxiety    Asthma    Gallstones    Hereditary spherocytosis (HCC)    Past Surgical History:  Procedure Laterality Date   CHOLECYSTECTOMY     SPLENECTOMY, TOTAL     TONSILLECTOMY     TYMPANOSTOMY TUBE PLACEMENT      Medications: I have reviewed the patient's current medications. Allergies:  Allergies  Allergen Reactions   Morphine Palpitations    Made Hot and pains in back   Augmentin [Amoxicillin-Pot Clavulanate] Diarrhea    Family History  Problem Relation Age of Onset   Hypertension Mother    Hereditary spherocytosis Mother    Hiatal hernia Mother    Hereditary spherocytosis Sister    Asthma Sister    Asthma Brother    Kidney Stones Brother    Heart disease Maternal Grandmother    Aneurysm Maternal Grandmother        brain   Diabetes Maternal Grandmother    Hypertension Maternal Grandmother    Heart disease Maternal Grandfather    Hypertension Maternal Grandfather    Brain cancer Paternal Grandmother    Prostate cancer Paternal Grandfather    Testicular cancer Maternal Uncle    Breast cancer Other         Maternal great aunt   Social History:  reports that she has never smoked. She has never used smokeless tobacco. She reports current alcohol use. She reports that she does not use drugs.  Review of Systems  Constitutional:  Positive for fever.  Genitourinary:  Positive for flank pain.  All other systems reviewed and are negative.   Physical Exam:  Vital signs in last 24 hours: Temp:  [98.2 F (36.8 C)-100 F (37.8 C)] 98.2 F (36.8 C) (06/30 1909) Pulse Rate:  [72-104] 88 (06/30 1210) Resp:  [15-24] 24 (06/30 1700) BP: (104-120)/(50-64) 120/62 (06/30 1210) SpO2:  [95 %-98 %] 95 % (06/30 1210) Physical Exam Vitals reviewed.  Constitutional:      Appearance: Normal appearance.  HENT:     Head: Normocephalic and atraumatic.     Nose: Nose normal.  Eyes:     Extraocular Movements: Extraocular movements intact.     Pupils: Pupils are equal, round, and reactive to light.  Cardiovascular:     Rate and Rhythm: Normal rate and regular rhythm.  Pulmonary:     Effort: Pulmonary effort is normal. No respiratory distress.  Abdominal:     General: Abdomen is flat. There is no distension.  Musculoskeletal:        General: No swelling. Normal range of motion.     Cervical back: Normal range of motion and neck supple.  Skin:  General: Skin is warm and dry.  Neurological:     General: No focal deficit present.     Mental Status: She is alert and oriented to person, place, and time.  Psychiatric:        Mood and Affect: Mood normal.        Behavior: Behavior normal.        Thought Content: Thought content normal.        Judgment: Judgment normal.     Laboratory Data:  Results for orders placed or performed during the hospital encounter of 05/10/22 (from the past 72 hour(s))  Pregnancy, urine     Status: None   Collection Time: 05/10/22  2:40 PM  Result Value Ref Range   Preg Test, Ur NEGATIVE NEGATIVE    Comment:        THE SENSITIVITY OF THIS METHODOLOGY IS >20  mIU/mL. Performed at [facility], [address] [place], [place] [address]   Urine Culture     Status: Abnormal (Preliminary result)   Collection Time: 05/10/22  2:40 PM   Specimen: Urine, Clean Catch  Result Value Ref Range   Specimen Description      URINE, CLEAN CATCH Performed at [facility], [address] [place], [place] [address]    Special Requests      NONE Performed at [facility], [address] [place], [place] [address]    Culture >=100,000 COLONIES/mL ESCHERICHIA COLI (A)    Report Status PENDING   Urinalysis, Routine w reflex microscopic Urine, Clean Catch     Status: Abnormal   Collection Time: 05/10/22  2:41 PM  Result Value Ref Range   Color, Urine YELLOW YELLOW   APPearance HAZY (A) CLEAR   Specific Gravity, Urine 1.017 1.005 - 1.030   pH 6.0 5.0 - 8.0   Glucose, UA NEGATIVE NEGATIVE mg/dL   Hgb urine dipstick MODERATE (A) NEGATIVE   Bilirubin Urine NEGATIVE NEGATIVE   Ketones, ur 40 (A) NEGATIVE mg/dL   Protein, ur 638 (A) NEGATIVE mg/dL   Nitrite POSITIVE (A) NEGATIVE   Leukocytes,Ua LARGE (A) NEGATIVE   RBC / HPF 11-20 0 - 5 RBC/hpf   WBC, UA >50 (H) 0 - 5 WBC/hpf   Bacteria, UA FEW (A) NONE SEEN   Squamous Epithelial / LPF 6-10 0 - 5   WBC Clumps PRESENT    Mucus PRESENT    Non Squamous Epithelial 0-5 (A) NONE SEEN    Comment: Performed at [facility], [address] [place], [place] [address]  Basic metabolic panel     Status: Abnormal   Collection Time: 05/10/22  2:41 PM  Result Value Ref Range   Sodium 131 (L) 135 - 145 mmol/L   Potassium 3.2 (L) 3.5 - 5.1 mmol/L   Chloride 96 (L) 98 - 111 mmol/L   CO2 24 22 - 32 mmol/L   Glucose, Bld 88 70 - 99 mg/dL    Comment: Glucose reference range applies only to samples taken after fasting for at least 8 hours.   BUN 11 6 - 20 mg/dL   Creatinine, Ser 3.29 0.44 - 1.00 mg/dL   Calcium 51.8 8.9 - 84.1 mg/dL   GFR,  Estimated >66 >06 mL/min    Comment: (NOTE) Calculated using the CKD-EPI Creatinine Equation (2021)    Anion gap 11 5 - 15    Comment: Performed at [facility], [address] [place], [place] [address]  CBC     Status:  Abnormal   Collection Time: 05/10/22  2:41 PM  Result Value Ref Range   WBC 26.0 (H) 4.0 - 10.5 K/uL   RBC 4.28 3.87 - 5.11 MIL/uL   Hemoglobin 14.0 12.0 - 15.0 g/dL   HCT 02.5 42.7 - 06.2 %   MCV 89.7 80.0 - 100.0 fL   MCH 32.7 26.0 - 34.0 pg   MCHC 36.5 (H) 30.0 - 36.0 g/dL   RDW 37.6 28.3 - 15.1 %   Platelets 485 (H) 150 - 400 K/uL   nRBC 0.0 0.0 - 0.2 %    Comment: Performed at [facility], [address], [place], [place] [address]  Differential     Status: Abnormal   Collection Time: 05/10/22  2:41 PM  Result Value Ref Range   Neutrophils Relative % 83 %   Neutro Abs 22.1 (H) 1.7 - 7.7 K/uL   Lymphocytes Relative 5 %   Lymphs Abs 1.3 0.7 - 4.0 K/uL   Monocytes Relative 11 %   Monocytes Absolute 2.9 (H) 0.1 - 1.0 K/uL   Eosinophils Relative 0 %   Eosinophils Absolute 0.0 0.0 - 0.5 K/uL   Basophils Relative 0 %   Basophils Absolute 0.1 0.0 - 0.1 K/uL   WBC Morphology WHITE COUNT CONFIRMED ON SMEAR     Comment: MORPHOLOGY UNREMARKABLE   RBC Morphology BURR CELLS    Smear Review Reviewed    Other PLATELETS APPEAR INCREASED %   Immature Granulocytes 1 %   Abs Immature Granulocytes 0.15 (H) 0.00 - 0.07 K/uL    Comment: Performed at [facility], [address], [place], [place] [address]  Blood culture (routine x 2)     Status: None (Preliminary result)   Collection Time: 05/10/22  4:08 PM   Specimen: BLOOD  Result Value Ref Range   Specimen Description      BLOOD BLOOD LEFT HAND Performed at [facility], [address] [place], [place] [address]    Special Requests      Blood Culture adequate volume BOTTLES DRAWN AEROBIC AND ANAEROBIC Performed at [facility], [address] [place], [place] [address]    Culture      NO GROWTH < 24 HOURS Performed at [facility], [address], [place], [place] [address]    Report Status PENDING   Blood culture (routine x 2)     Status: None (Preliminary result)   Collection Time: 05/10/22  4:08 PM   Specimen: BLOOD  Result Value Ref Range   Specimen Description      BLOOD LEFT ANTECUBITAL Performed at [facility], [address], [place], [place] [address]    Special Requests      Blood Culture adequate volume BOTTLES DRAWN AEROBIC AND ANAEROBIC Performed at [facility], [address], [place], [place] [address]    Culture      NO GROWTH < 24 HOURS Performed at [facility], [address], [place], [place] [address]    Report Status PENDING   Lactic acid, plasma     Status: None   Collection Time: 05/10/22  4:08 PM  Result Value Ref Range   Lactic Acid, Venous 0.7 0.5 - 1.9 mmol/L    Comment: Performed at [facility], [address], [place], [place] [address]  CBC with Differential/Platelet     Status: Abnormal   Collection Time: 05/11/22  4:20 AM  Result Value Ref Range   WBC 29.1 (H)  4.0 - 10.5 K/uL   RBC 3.83 (L) 3.87 - 5.11 MIL/uL   Hemoglobin 12.7 12.0 - 15.0 g/dL   HCT 41.6 (L) 60.6 - 30.1 %   MCV 91.9 80.0 - 100.0 fL   MCH 33.2 26.0 - 34.0 pg   MCHC 36.1 (H) 30.0 - 36.0 g/dL   RDW 60.1 09.3 - 23.5 %   Platelets 448 (H) 150 - 400 K/uL   nRBC 0.0 0.0 - 0.2 %   Neutrophils Relative % 80 %   Neutro Abs 23.2 (H) 1.7 - 7.7 K/uL   Lymphocytes Relative 4 %   Lymphs Abs 1.1 0.7 - 4.0 K/uL   Monocytes Relative 15 %   Monocytes Absolute 4.4 (H) 0.1 - 1.0 K/uL   Eosinophils Relative 0 %   Eosinophils Absolute 0.0 0.0 - 0.5 K/uL   Basophils Relative 0 %   Basophils Absolute 0.1 0.0 - 0.1 K/uL   Immature Granulocytes 1 %   Abs Immature Granulocytes 0.31 (H) 0.00 - 0.07 K/uL    Comment: Performed  at [facility], [address], [place], [place] [address]  Comprehensive metabolic panel     Status: Abnormal   Collection Time: 05/11/22  4:20 AM  Result Value Ref Range   Sodium 138 135 - 145 mmol/L   Potassium 3.4 (L) 3.5 - 5.1 mmol/L   Chloride 108 98 - 111 mmol/L   CO2 22 22 - 32 mmol/L   Glucose, Bld 109 (H) 70 - 99 mg/dL    Comment: Glucose reference range applies only to samples taken after fasting for at least 8 hours.   BUN 9 6 - 20 mg/dL   Creatinine, Ser 0.25 0.44 - 1.00 mg/dL   Calcium 8.7 (L) 8.9 - 10.3 mg/dL   Total Protein 6.3 (L) 6.5 - 8.1 g/dL   Albumin 3.2 (L) 3.5 - 5.0 g/dL   AST 18 15 - 41 U/L   ALT 24 0 - 44 U/L   Alkaline Phosphatase 75 38 - 126 U/L   Total Bilirubin 2.0 (H) 0.3 - 1.2 mg/dL   GFR, Estimated >42 >70 mL/min    Comment: (NOTE) Calculated using the CKD-EPI Creatinine Equation (2021)    Anion gap 8 5 - 15    Comment: Performed at [facility], [address], [place], [place] [address]  Magnesium     Status: None   Collection Time: 05/11/22  4:20 AM  Result Value Ref Range   Magnesium 1.7 1.7 - 2.4 mg/dL    Comment: Performed at [facility], [address] [place], [place] [address]  Phosphorus     Status: None   Collection Time: 05/11/22  4:20 AM  Result Value Ref Range   Phosphorus 2.6 2.5 - 4.6 mg/dL    Comment: Performed at [facility], [address], [place], [place] [address]  HIV Antibody (routine testing w rflx)     Status: None   Collection Time: 05/11/22  4:20 AM  Result Value Ref Range   HIV Screen 4th Generation wRfx Non Reactive Non Reactive    Comment: Performed at [facility], [address] [place], [place] [address]   Recent Results (from the past 240 hour(s))  Urine Culture     Status: Abnormal (Preliminary result)   Collection Time: 05/10/22  2:40 PM   Specimen: Urine, Clean Catch  Result Value Ref Range Status   Specimen  Description   Final  URINE, CLEAN CATCH Performed at [facility], [address], [place], [place] [address]    Special Requests   Final    NONE Performed at [facility], [address], [place], [place] [address]    Culture >=100,000 COLONIES/mL ESCHERICHIA COLI (A)  Final   Report Status PENDING  Incomplete  Blood culture (routine x 2)     Status: None (Preliminary result)   Collection Time: 05/10/22  4:08 PM   Specimen: BLOOD  Result Value Ref Range Status   Specimen Description   Final    BLOOD BLOOD LEFT HAND Performed at [facility], [address] [place], [place] [address]    Special Requests   Final    Blood Culture adequate volume BOTTLES DRAWN AEROBIC AND ANAEROBIC Performed at [facility], [address] [place], [place] [address]    Culture   Final    NO GROWTH < 24 HOURS Performed at [facility], [address], [place], [place] [address]    Report Status PENDING  Incomplete  Blood culture (routine x 2)     Status: None (Preliminary result)   Collection Time: 05/10/22  4:08 PM   Specimen: BLOOD  Result Value Ref Range Status   Specimen Description   Final    BLOOD LEFT ANTECUBITAL Performed at [facility], [address] [place], [place] [address]    Special Requests   Final    Blood Culture adequate volume BOTTLES DRAWN AEROBIC AND ANAEROBIC Performed at [facility], [address], [place], [place] [address]    Culture   Final    NO GROWTH < 24 HOURS Performed at [facility], [address] [place], [place] [address]    Report Status PENDING  Incomplete   Creatinine: Recent Labs    05/10/22 1441 05/11/22 0420  CREATININE 0.90 0.74   Baseline Creatinine: 0.7  Impression/Assessment:  21yo with right pyelonephritis, hx of nephrolithiasis  Plan:  Pyelonephritis: I discussed management of pyelonephritis  with the patient and her family. Since she is improving on rocephin she should remain on rocephin pending her urine culture. If she developed a fever over 101 a CT abd/pelvic w/wo contrast should be obtained to rule out renal abscess Nephrolithiasis: I discussed the metabolic workup of nephrolithiasis with the patient and family. We have elected to pursue metabolic evaluation as an outpatient  Andrea Gaines 05/11/2022, 7:24 PM

## 2022-05-11 NOTE — Progress Notes (Signed)
PROGRESS NOTE    Andrea Gaines  [id number] DOB: August 23, 2001 DOA: 05/10/2022 PCP: Eunice Blase, MD   Brief Narrative:  Patient is a 21 year old female with a past medical history significant for but not limited to GERD, history of prior splenectomy as well as other comorbidities who presented to [facility] urgent ED with complaints of hematuria and bilateral flank pain with concern for bilateral pyelonephritis.  She was recently treated in outpatient setting for urinary tract infection and recently passed renal stones a week of 6/23-6/25 and she had a noncontrast CT scan on 05/07/2022 which showed mild dilation of the right renal pelvis with out obstructing stone identified and this was likely due to a recently passed stone.  Upon presentation to the droppage emergency room work-up revealed a UA that was positive for pyuria, leukocytosis of 26,000 and tachycardia.  Cultures were obtained and patient was started empiric antibiotics with IV Rocephin.  Initially she received a liter IV fluid bolus of LR and was given antiemetics and Toradol as well as acetaminophen.  [facility] admit this patient and she was placed on telemetry unit.  Renal ultrasound done on 05/10/2022 was normal and showed no evidence of hydronephrosis bilaterally. WBC has slightly worsened.    Assessment and Plan: No notes have been filed under this hospital service. Service: Hospitalist  Sepsis secondary to UTI, presumed bilateral pyelonephritis, POA -Presented with hematuria, bilateral flank pain -UA positive for pyuria, leukocytosis WBC 26,000, Tachycardia pulse 109. -Urinalysis done showed a hazy appearance with moderate hemoglobin, 40 ketones, large leukocytes, positive nitrites, 100 protein, few bacteria, present mucus, 0-5 non-squamous epithelial cells, 11-20 RBCs per high-power field and greater than 50 WBCs -Cultures obtained and pending -Follow peripheral blood cultures x2 as well as urine culture -Narrow down  antibiotics as indicated with ID and sensitivities. -CT of the abdomen pelvis without contrast 05/07/2022 done and showed "Mild dilatation of the right renal pelvis without obstructing stone identified. This could be due to recently passed stone. Small amount of free fluid identified in the right pelvis, likely physiologic." -Normal saline KCl at 75 cc/h x 2 days -Lactic acid level 0.7 -Renal ultrasound done and showed a normal renal ultrasound -Maintain MAP greater than 65. -Monitor WBC and fever curve -Closely monitor vital signs. -Pain control and bowel regimen   Hematuria likely multifactorial -Secondary to passing kidney stones and from UTI -Continuing IV fluid hydration -Continue to treat underlying conditions -Continue to monitor for signs and symptoms bleeding; no overt bleeding noted; hemoglobin/hematocrit went from 14.0/38.4 is now 12.7/35.2 -Repeat CBC in a.m.   Hypovolemic hyponatremia -Presented with serum sodium 131 and now improved to 138 -IV fluid hydration NS KCl 40 mEq at 75 cc/h x 2 days just changed to normal saline -Continue to monitor urine output and strict I's and O's and to monitor and trend and repeat CMP in the a.m.   Hypokalemia -Serum potassium 3.2 on Admission and now is 3.4 this AM  -Repleted intravenously with IV fluid as she is on NS + Kcl 40 mEQ at 75 mL/hr; Received IV Kcl but was burning so will replete orally with po Kcl 40 mEQ BID x2 and stop the normal saline with KCl and just start KCl -Mag Level was 1.7 and will replete -Continue to Monitor and Replete as Necessary -Repeat CMP in the AM   Thrombocytosis -Platelet went from 485 -> 448 -Likely Reactive in the setting of Above -Continue to Monitor and Trend -Repeat CBC in the AM   Hyperbilirubinemia -  Likely Reactive -Patients T Bili this AM was 2.0 -Continue to Monitor and Trend -Repeat CMP in the AM  Hypoalbuminemia -Patient's albumin level is now 3.2 -Continue to monitor trend and  repeat CMP in a.m.  DVT prophylaxis: Place and maintain sequential compression device Start: 05/10/22 2100    Code Status: Full Code Family Communication: Discussed with family at bedside  Disposition Plan:  Level of care: Telemetry Status is: Observation The patient will require care spanning > 2 midnights and should be moved to inpatient because: She continues to be treated for acute bilateral pyelonephritis and her sepsis and WBC   Consultants:  Urology was consulted by the EDP  Procedures:  None  Antimicrobials:  Anti-infectives (From admission, onward)    Start     Dose/Rate Route Frequency Ordered Stop   05/11/22 1000  cefTRIAXone (ROCEPHIN) 2 g in sodium chloride 0.9 % 100 mL IVPB        2 g 200 mL/hr over 30 Minutes Intravenous Every 24 hours 05/10/22 1946     05/10/22 1515  cefTRIAXone (ROCEPHIN) 1 g in sodium chloride 0.9 % 100 mL IVPB        1 g 200 mL/hr over 30 Minutes Intravenous  Once 05/10/22 1510 05/10/22 1736       Subjective: Seen and examined at bedside and she was little confused.  Denies any abdominal complaints now and denies any pain.  Denies any burning or discomfort in her urine.  Nursing states that the potassium with the normal saline was burning so this was discontinued.  No other concerns or complaints at this time.  Objective: Vitals:   05/10/22 2318 05/11/22 0329 05/11/22 0700 05/11/22 0737  BP: (!) 113/54 117/60  119/64  Pulse: 94 (!) 104  (!) 102  Resp: 20 18 (!) 24 20  Temp: 100 F (37.8 C) 99.5 F (37.5 C)  98.2 F (36.8 C)  TempSrc: Oral Oral  Oral  SpO2: 97% 95%  98%  Weight:      Height:        Intake/Output Summary (Last 24 hours) at 05/11/2022 0910 Last data filed at 05/11/2022 0200 Gross per 24 hour  Intake 1433.29 ml  Output --  Net 1433.29 ml   Filed Weights   05/10/22 1424  Weight: 74.4 kg   Examination: Physical Exam:  Constitutional: WN/WD young Caucasian female currently no acute distress Respiratory:  Diminished to auscultation bilaterally, no wheezing, rales, rhonchi or crackles. Normal respiratory effort and patient is not tachypenic. No accessory muscle use.  Unlabored breathing Cardiovascular: Mildly tachycardic, no murmurs / rubs / gallops. S1 and S2 auscultated. No extremity edema. Abdomen: Soft, non-tender, non-distended. Bowel sounds positive.  GU: Deferred. Musculoskeletal: No clubbing / cyanosis of digits/nails. No joint deformity upper and lower extremities.  Skin: No rashes, lesions, ulcers. No induration; Warm and dry.  Neurologic: CN 2-12 grossly intact with no focal deficits. Romberg sign and cerebellar reflexes not assessed.  Psychiatric: Normal judgment and insight. Alert and oriented x 3. Normal mood and appropriate affect.   Data Reviewed: I have personally reviewed following labs and imaging studies  CBC: Recent Labs  Lab 05/10/22 1441 05/11/22 0420  WBC 26.0* 29.1*  NEUTROABS 22.1* 23.2*  HGB 14.0 12.7  HCT 38.4 35.2*  MCV 89.7 91.9  PLT 485* 448*   Basic Metabolic Panel: Recent Labs  Lab 05/10/22 1441 05/11/22 0420  NA 131* 138  K 3.2* 3.4*  CL 96* 108  CO2 24 22  GLUCOSE 88 109*  BUN 11 9  CREATININE 0.90 0.74  CALCIUM 10.0 8.7*  MG  --  1.7  PHOS  --  2.6   GFR: Estimated Creatinine Clearance: 113 mL/min (by C-G formula based on SCr of 0.74 mg/dL). Liver Function Tests: Recent Labs  Lab 05/11/22 0420  AST 18  ALT 24  ALKPHOS 75  BILITOT 2.0*  PROT 6.3*  ALBUMIN 3.2*   No results for input(s): "LIPASE", "AMYLASE" in the last 168 hours. No results for input(s): "AMMONIA" in the last 168 hours. Coagulation Profile: No results for input(s): "INR", "PROTIME" in the last 168 hours. Cardiac Enzymes: No results for input(s): "CKTOTAL", "CKMB", "CKMBINDEX", "TROPONINI" in the last 168 hours. BNP (last 3 results) No results for input(s): "PROBNP" in the last 8760 hours. HbA1C: No results for input(s): "HGBA1C" in the last 72  hours. CBG: No results for input(s): "GLUCAP" in the last 168 hours. Lipid Profile: No results for input(s): "CHOL", "HDL", "LDLCALC", "TRIG", "CHOLHDL", "LDLDIRECT" in the last 72 hours. Thyroid Function Tests: No results for input(s): "TSH", "T4TOTAL", "FREET4", "T3FREE", "THYROIDAB" in the last 72 hours. Anemia Panel: No results for input(s): "VITAMINB12", "FOLATE", "FERRITIN", "TIBC", "IRON", "RETICCTPCT" in the last 72 hours. Sepsis Labs: Recent Labs  Lab 05/10/22 1608  LATICACIDVEN 0.7    No results found for this or any previous visit (from the past 240 hour(s)).   Radiology Studies: US Renal  Result Date: 05/10/2022 CLINICAL DATA:  Fever, flank pain, and hematuria. Passed kidney stones five days ago. EXAM: RENAL / URINARY TRACT ULTRASOUND COMPLETE COMPARISON:  CT abdomen pelvis dated May 07, 2022. FINDINGS: Right Kidney: Renal measurements: 11.7 x 4.1 x 5.3 cm = volume: 131 mL. Echogenicity within normal limits. No mass or hydronephrosis visualized. Left Kidney: Renal measurements: 11.7 x 5.4 x 4.5 cm = volume: 147 mL. Echogenicity within normal limits. No mass or hydronephrosis visualized. Bladder: Appears normal for degree of bladder distention. Other: Unchanged trace free fluid in the pelvis, likely physiologic. IMPRESSION: 1. Normal renal ultrasound. Electronically Signed   By: Obie Dredge M.D.   On: 05/10/2022 18:04    Scheduled Meds:  potassium chloride  40 mEq Oral BID   Continuous Infusions:  0.9 % NaCl with KCl 40 mEq / L Stopped (05/11/22 0801)   cefTRIAXone (ROCEPHIN)  IV     magnesium sulfate bolus IVPB      LOS: 0 days   Marguerita Merles, DO [facility] Available via Epic secure chat 7am-7pm After these hours, please refer to coverage provider listed on [email or web address] 05/11/2022, 9:10 AM

## 2022-05-12 DIAGNOSIS — N390 Urinary tract infection, site not specified: Secondary | ICD-10-CM

## 2022-05-12 DIAGNOSIS — E876 Hypokalemia: Secondary | ICD-10-CM

## 2022-05-12 DIAGNOSIS — B9620 Unspecified Escherichia coli [E. coli] as the cause of diseases classified elsewhere: Secondary | ICD-10-CM

## 2022-05-12 DIAGNOSIS — A4151 Sepsis due to Escherichia coli [E. coli]: Secondary | ICD-10-CM

## 2022-05-12 LAB — CBC WITH DIFFERENTIAL/PLATELET
Abs Immature Granulocytes: 0.09 10*3/uL — ABNORMAL HIGH (ref 0.00–0.07)
Basophils Absolute: 0.1 10*3/uL (ref 0.0–0.1)
Basophils Relative: 0 %
Eosinophils Absolute: 0.1 10*3/uL (ref 0.0–0.5)
Eosinophils Relative: 1 %
HCT: 34.4 % — ABNORMAL LOW (ref 36.0–46.0)
Hemoglobin: 12 g/dL (ref 12.0–15.0)
Immature Granulocytes: 1 %
Lymphocytes Relative: 15 %
Lymphs Abs: 2.7 10*3/uL (ref 0.7–4.0)
MCH: 33.1 pg (ref 26.0–34.0)
MCHC: 34.9 g/dL (ref 30.0–36.0)
MCV: 94.8 fL (ref 80.0–100.0)
Monocytes Absolute: 2.5 10*3/uL — ABNORMAL HIGH (ref 0.1–1.0)
Monocytes Relative: 14 %
Neutro Abs: 12.4 10*3/uL — ABNORMAL HIGH (ref 1.7–7.7)
Neutrophils Relative %: 69 %
Platelets: 441 10*3/uL — ABNORMAL HIGH (ref 150–400)
RBC: 3.63 MIL/uL — ABNORMAL LOW (ref 3.87–5.11)
RDW: 12.5 % (ref 11.5–15.5)
WBC: 17.8 10*3/uL — ABNORMAL HIGH (ref 4.0–10.5)
nRBC: 0 % (ref 0.0–0.2)

## 2022-05-12 LAB — COMPREHENSIVE METABOLIC PANEL
ALT: 23 U/L (ref 0–44)
AST: 17 U/L (ref 15–41)
Albumin: 3.2 g/dL — ABNORMAL LOW (ref 3.5–5.0)
Alkaline Phosphatase: 68 U/L (ref 38–126)
Anion gap: 7 (ref 5–15)
BUN: 6 mg/dL (ref 6–20)
CO2: 23 mmol/L (ref 22–32)
Calcium: 9 mg/dL (ref 8.9–10.3)
Chloride: 112 mmol/L — ABNORMAL HIGH (ref 98–111)
Creatinine, Ser: 0.66 mg/dL (ref 0.44–1.00)
GFR, Estimated: >60 mL/min (ref >60)
Glucose, Bld: 98 mg/dL (ref 70–99)
Potassium: 4.4 mmol/L (ref 3.5–5.1)
Sodium: 142 mmol/L (ref 135–145)
Total Bilirubin: 1.5 mg/dL — ABNORMAL HIGH (ref 0.3–1.2)
Total Protein: 6.2 g/dL — ABNORMAL LOW (ref 6.5–8.1)

## 2022-05-12 LAB — URINE CULTURE: Culture: >=100000 — AB

## 2022-05-12 LAB — PHOSPHORUS: Phosphorus: 3.3 mg/dL (ref 2.5–4.6)

## 2022-05-12 LAB — MAGNESIUM: Magnesium: 2 mg/dL (ref 1.7–2.4)

## 2022-05-12 MED ORDER — CIPROFLOXACIN HCL 500 MG PO TABS: 500.0000 mg | ORAL_TABLET | Freq: Two times a day (BID) | ORAL | 0 refills | Status: AC

## 2022-05-12 MED ORDER — ONDANSETRON HCL 4 MG PO TABS: 4.0000 mg | ORAL_TABLET | Freq: Three times a day (TID) | ORAL | 0 refills | Status: DC | PRN

## 2022-05-12 MED ORDER — POLYETHYLENE GLYCOL 3350 17 G PO PACK: 17.0000 g | PACK | Freq: Every day | ORAL | 0 refills | Status: AC | PRN

## 2022-05-12 MED ORDER — ACETAMINOPHEN 325 MG PO TABS: 650.0000 mg | ORAL_TABLET | Freq: Four times a day (QID) | ORAL | 0 refills | Status: AC | PRN

## 2022-05-12 NOTE — Progress Notes (Signed)
Subjective: Temp 99.1 overnight. No right flank pain. Urine culture E coli resistant to Bactrim. No other complaints  Objective: Vital signs in last 24 hours: Temp:  [98.2 F (36.8 C)-99.1 F (37.3 C)] 98.5 F (36.9 C) (07/01 0556) Pulse Rate:  [73-93] 73 (07/01 0556) Resp:  [15-24] 20 (07/01 0556) BP: (113-124)/(60-70) 124/70 (07/01 0556) SpO2:  [95 %-98 %] 97 % (07/01 0556)  Intake/Output from previous day: 06/30 0701 - 07/01 0700 In: 1841.7 [P.O.:340; I.V.:1401.7; IV Piggyback:100] Out: -  Intake/Output this shift: No intake/output data recorded.  Physical Exam:  General:alert, cooperative, and appears stated age GI: soft, non tender, normal bowel sounds, no palpable masses, no organomegaly, no inguinal hernia Female genitalia: not done Extremities: extremities normal, atraumatic, no cyanosis or edema  Lab Results: Recent Labs    05/10/22 1441 05/11/22 0420 05/12/22 0449  HGB 14.0 12.7 12.0  HCT 38.4 35.2* 34.4*   BMET Recent Labs    05/11/22 0420 05/12/22 0449  NA 138 142  K 3.4* 4.4  CL 108 112*  CO2 22 23  GLUCOSE 109* 98  BUN 9 6  CREATININE 0.74 0.66  CALCIUM 8.7* 9.0   No results for input(s): "LABPT", "INR" in the last 72 hours. No results for input(s): "LABURIN" in the last 72 hours. Results for orders placed or performed during the hospital encounter of 05/10/22  Urine Culture     Status: Abnormal   Collection Time: 05/10/22  2:40 PM   Specimen: Urine, Clean Catch  Result Value Ref Range Status   Specimen Description   Final    URINE, CLEAN CATCH Performed at [facility], [address] [place], [place] [address]    Special Requests   Final    NONE Performed at [facility], [address], [place], [place] [address]    Culture >=100,000 COLONIES/mL ESCHERICHIA COLI (A)  Final   Report Status 05/12/2022 FINAL  Final   Organism ID, Bacteria ESCHERICHIA COLI (A)  Final      Susceptibility    Escherichia coli - MIC*    AMPICILLIN <=2 SENSITIVE Sensitive     CEFAZOLIN <=4 SENSITIVE Sensitive     CEFEPIME <=0.12 SENSITIVE Sensitive     CEFTRIAXONE <=0.25 SENSITIVE Sensitive     CIPROFLOXACIN <=0.25 SENSITIVE Sensitive     GENTAMICIN <=1 SENSITIVE Sensitive     IMIPENEM <=0.25 SENSITIVE Sensitive     NITROFURANTOIN <=16 SENSITIVE Sensitive     TRIMETH/SULFA >=320 RESISTANT Resistant     AMPICILLIN/SULBACTAM <=2 SENSITIVE Sensitive     PIP/TAZO <=4 SENSITIVE Sensitive     * >=100,000 COLONIES/mL ESCHERICHIA COLI  Blood culture (routine x 2)     Status: None (Preliminary result)   Collection Time: 05/10/22  4:08 PM   Specimen: BLOOD  Result Value Ref Range Status   Specimen Description   Final    BLOOD BLOOD LEFT HAND Performed at [facility], [address], [place], [place] [address]    Special Requests   Final    Blood Culture adequate volume BOTTLES DRAWN AEROBIC AND ANAEROBIC Performed at [facility], [address], [place], [place] [address]    Culture   Final    NO GROWTH 2 DAYS Performed at [facility], [address], [place], [place] [address]    Report Status PENDING  Incomplete  Blood culture (routine x 2)     Status: None (Preliminary result)   Collection Time: 05/10/22  4:08 PM   Specimen:  BLOOD  Result Value Ref Range Status   Specimen Description   Final    BLOOD LEFT ANTECUBITAL Performed at [facility], [address], [place], [place] [address]    Special Requests   Final    Blood Culture adequate volume BOTTLES DRAWN AEROBIC AND ANAEROBIC Performed at [facility], [address], [place], [place] [address]    Culture   Final    NO GROWTH 2 DAYS Performed at [facility], [address], [place], [place] [address]    Report Status PENDING  Incomplete    Studies/Results: US Renal  Result Date: 05/10/2022 CLINICAL DATA:  Fever, flank pain, and  hematuria. Passed kidney stones five days ago. EXAM: RENAL / URINARY TRACT ULTRASOUND COMPLETE COMPARISON:  CT abdomen pelvis dated May 07, 2022. FINDINGS: Right Kidney: Renal measurements: 11.7 x 4.1 x 5.3 cm = volume: 131 mL. Echogenicity within normal limits. No mass or hydronephrosis visualized. Left Kidney: Renal measurements: 11.7 x 5.4 x 4.5 cm = volume: 147 mL. Echogenicity within normal limits. No mass or hydronephrosis visualized. Bladder: Appears normal for degree of bladder distention. Other: Unchanged trace free fluid in the pelvis, likely physiologic. IMPRESSION: 1. Normal renal ultrasound. Electronically Signed   By: Obie Dredge M.D.   On: 05/10/2022 18:04    Assessment/Plan: 21yo with pyelonephritis and hx of nephrolithiasis  Pyelonephritis: Patient can be transitioned to cipro and discharged home from Urology standpoint. She will followup in my office in 1 month for metabolic stone evaluation   LOS: 1 day   Wilkie Aye 05/12/2022, 10:03 AM

## 2022-05-12 NOTE — Discharge Summary (Signed)
Physician Discharge Summary   Patient: Andrea Gaines MRN: [id number] DOB: 05-Oct-2001  Admit date:     05/10/2022  Discharge date: 05/12/22  Discharge Physician: Marguerita Merles, DO   PCP: Lavada Mesi, MD   Recommendations at discharge:   Follow-up with PCP within 1 to 2 weeks and repeat CBC, CMP, mag, Phos within 1 week Continue ciprofloxacin for 10 days and follow-up with urology in outpatient setting within 1 to 2 weeks  Discharge Diagnoses: Principal Problem:   Acute pyelonephritis  Resolved Problems:   * No resolved hospital problems. *  Hospital Course: Patient is a 21 year old female with a past medical history significant for but not limited to GERD, history of prior splenectomy as well as other comorbidities who presented to [facility] urgent ED with complaints of hematuria and bilateral flank pain with concern for bilateral pyelonephritis.  She was recently treated in outpatient setting for urinary tract infection and recently passed renal stones a week of 6/23-6/25 and she had a noncontrast CT scan on 05/07/2022 which showed mild dilation of the right renal pelvis with out obstructing stone identified and this was likely due to a recently passed stone.  Upon presentation to the [facility] emergency room work-up revealed a UA that was positive for pyuria, leukocytosis of 26,000 and tachycardia.  Cultures were obtained and patient was started empiric antibiotics with IV Rocephin.  Initially she received a liter IV fluid bolus of LR and was given antiemetics and Toradol as well as acetaminophen.  [facility] admit this patient and she was placed on telemetry unit.  Renal ultrasound done on 05/10/2022 was normal and showed no evidence of hydronephrosis bilaterally. WBC has slightly worsened yesterday but is further improved.  Further results show that she has a E. coli UTI and pyelonephritis that is only resistant to Bactrim.  She was changed to p.o. ciprofloxacin given that she is improved  and she was discharged home in stable condition.  She will need to follow-up on her blood cultures and follow-up with PCP and urology within 1 week  Assessment and Plan: No notes have been filed under this hospital service. Service: Hospitalist  Sepsis secondary to E. coli UTI, presumed bilateral pyelonephritis, POA -Presented with hematuria, bilateral flank pain -UA positive for pyuria, leukocytosis WBC 26,000, Tachycardia pulse 109. -Urinalysis done showed a hazy appearance with moderate hemoglobin, 40 ketones, large leukocytes, positive nitrites, 100 protein, few bacteria, present mucus, 0-5 non-squamous epithelial cells, 11-20 RBCs per high-power field and greater than 50 WBCs -Cultures obtained and pending and blood cultures are pending but urine culture showed E. coli that was resistant to Bactrim -Follow peripheral blood cultures x2 and currently showing no growth to date at 2 days -Narrow down antibiotics as indicated with ID and sensitivities.  Now that we know there is E. coli we have changed her to p.o. ciprofloxacin per urology recommendations for 10 days -CT of the abdomen pelvis without contrast 05/07/2022 done and showed "Mild dilatation of the right renal pelvis without obstructing stone identified. This could be due to recently passed stone. Small amount of free fluid identified in the right pelvis, likely physiologic." -Normal saline KCl at 75 cc/h x 2 days -Lactic acid level 0.7 -WBC has slightly worsened to 29.1 yesterday but is now improved to 17.8 -Renal ultrasound done and showed a normal renal ultrasound -Maintain MAP greater than 65. -Monitor WBC and fever curve and this is improved -Closely monitor vital signs. -Pain control and bowel regimen -Patient is improved and will  need to follow-up with PCP and urology in outpatient setting   Hematuria likely multifactorial -Secondary to passing kidney stones and from UTI -Continuing IV fluid hydration while  hospitalized -Continue to treat underlying conditions -Continue to monitor for signs and symptoms bleeding; no overt bleeding noted; hemoglobin/hematocrit went from 14.0/38.4 is now 12.7/35.2 yesterday and today is 12.0/34.4 -Repeat CBC within 1 week   Hypovolemic hyponatremia -Presented with serum sodium 131 and now improved to 138 yesterday and today is 142 -IV fluid hydration NS KCl 40 mEq at 75 cc/h x 2 days just changed to normal saline -Continue to monitor urine output and strict I's and O's and to monitor and trend and repeat CMP in the a.m.   Hypokalemia -Serum potassium 3.2 on Admission and now improved to 4.4 after repletion -Mag Level is now 2.0 -Continue to Monitor and Replete as Necessary -Repeat CMP in the AM    Thrombocytosis -Platelet went from 485 -> 448 and trended down to 441 -Likely Reactive in the setting of Above -Continue to Monitor and Trend -Repeat CBC in the AM    Hyperbilirubinemia -Likely Reactive -Patients T Bili yesterday AM was 2.0 now trended down to 1.5 -Continue to Monitor and Trend -Repeat CMP within 1 week   Hypoalbuminemia -Patient's albumin level is now 3.2 x2 -Continue to monitor trend and repeat CMP in a.m.   Consultants: Urology Procedures performed: Renal ultrasound Disposition: Home Diet recommendation:  Discharge Diet Orders (From admission, onward)     Start     Ordered   05/12/22 0000  Diet - low sodium heart healthy        05/12/22 1030           Regular diet DISCHARGE MEDICATION: Allergies as of 05/12/2022       Reactions   Morphine Palpitations   Made Hot and pains in back   Augmentin [amoxicillin-pot Clavulanate] Diarrhea        Medication List     STOP taking these medications    levETIRAcetam 250 MG tablet Commonly known as: KEPPRA   levofloxacin 250 MG tablet Commonly known as: LEVAQUIN       TAKE these medications    acetaminophen 325 MG tablet Commonly known as: TYLENOL Take 2 tablets  (650 mg total) by mouth every 6 (six) hours as needed for mild pain, fever or headache. What changed:  medication strength how much to take reasons to take this   busPIRone 7.5 MG tablet Commonly known as: BUSPAR Take 7.5 mg by mouth daily as needed (anxiety).   celecoxib 100 MG capsule Commonly known as: CELEBREX Take 100 mg by mouth daily as needed for moderate pain.   ciprofloxacin 500 MG tablet Commonly known as: Cipro Take 1 tablet (500 mg total) by mouth 2 (two) times daily for 10 days.   Flomax 0.4 MG Caps capsule Generic drug: tamsulosin Take 0.4 mg by mouth daily.   ondansetron 4 MG tablet Commonly known as: ZOFRAN Take 1 tablet (4 mg total) by mouth every 8 (eight) hours as needed for nausea or vomiting. What changed:  how much to take when to take this   polyethylene glycol 17 g packet Commonly known as: MIRALAX / GLYCOLAX Take 17 g by mouth daily as needed for mild constipation.   traMADol 50 MG tablet Commonly known as: ULTRAM Take 50-100 mg by mouth every 6 (six) hours as needed for severe pain.   Tri-Sprintec 0.18/0.215/0.25 MG-35 MCG tablet Generic drug: Norgestimate-Ethinyl Estradiol Triphasic Take 1 tablet  by mouth daily.        Follow-up Information     McKenzie, Mardene CelestePatrick L, MD. Call in 1 month(s).   Specialty: Urology Contact information: [address]  [place] [id number] [phone]                Discharge Exam: Ceasar MonsFiled Weights   05/10/22 1424  Weight: 74.4 kg   Vitals:   05/11/22 2119 05/12/22 0556  BP: 113/60 124/70  Pulse: 93 73  Resp: 20 20  Temp: 99.1 F (37.3 C) 98.5 F (36.9 C)  SpO2: 98% 97%   Examination: Physical Exam:  Constitutional: WN/WD Caucasian female currently no acute distress Respiratory: Clear to auscultation bilaterally, no wheezing, rales, rhonchi or crackles. Normal respiratory effort and patient is not tachypenic. No accessory muscle use.  Cardiovascular: RRR, no murmurs /  rubs / gallops. S1 and S2 auscultated. No extremity edema. 2+ pedal pulses. No carotid bruits.  Abdomen: Soft, non-tender, non-distended. Bowel sounds positive.  GU: Deferred. Musculoskeletal: No clubbing / cyanosis of digits/nails. No joint deformity upper and lower extremities Skin: No rashes, lesions, ulcers. No induration; Warm and dry.  Neurologic: CN 2-12 grossly intact with no focal deficits.  Romberg sign cerebellar reflexes not assessed.  Psychiatric: Normal judgment and insight. Alert and oriented x 3. Normal mood and appropriate affect.   Condition at discharge: stable  The results of significant diagnostics from this hospitalization (including imaging, microbiology, ancillary and laboratory) are listed below for reference.   Imaging Studies: US Renal  Result Date: 05/10/2022 CLINICAL DATA:  Fever, flank pain, and hematuria. Passed kidney stones five days ago. EXAM: RENAL / URINARY TRACT ULTRASOUND COMPLETE COMPARISON:  CT abdomen pelvis dated May 07, 2022. FINDINGS: Right Kidney: Renal measurements: 11.7 x 4.1 x 5.3 cm = volume: 131 mL. Echogenicity within normal limits. No mass or hydronephrosis visualized. Left Kidney: Renal measurements: 11.7 x 5.4 x 4.5 cm = volume: 147 mL. Echogenicity within normal limits. No mass or hydronephrosis visualized. Bladder: Appears normal for degree of bladder distention. Other: Unchanged trace free fluid in the pelvis, likely physiologic. IMPRESSION: 1. Normal renal ultrasound. Electronically Signed   By: Obie DredgeWilliam T Derry M.D.   On: 05/10/2022 18:04   CT ABDOMEN PELVIS WO CONTRAST  Result Date: 05/08/2022 CLINICAL DATA:  Dysuria, hematuria and bilateral flank pain. Patient passed 3 stones over the weekend. EXAM: CT ABDOMEN AND PELVIS WITHOUT CONTRAST TECHNIQUE: Multidetector CT imaging of the abdomen and pelvis was performed following the standard protocol without IV contrast. RADIATION DOSE REDUCTION: This exam was performed according to the  departmental dose-optimization program which includes automated exposure control, adjustment of the mA and/or kV according to patient size and/or use of iterative reconstruction technique. COMPARISON:  None Available. FINDINGS: Lower chest: No acute abnormality. Hepatobiliary: No focal liver abnormality is seen. Status post cholecystectomy. No biliary dilatation. Pancreas: Unremarkable. No pancreatic ductal dilatation or surrounding inflammatory changes. Spleen: Prior splenectomy. Adrenals/Urinary Tract: Adrenal glands are unremarkable. Mild dilatation of the right renal pelvis is identified. There is no left hydronephrosis. Kidneys are normal, without renal calculi, focal lesion. Bladder is unremarkable. Stomach/Bowel: Stomach is within normal limits. The appendix is not seen but no inflammation is noted around cecum. No evidence of bowel wall thickening, distention, or inflammatory changes. Vascular/Lymphatic: No significant vascular findings are present. No enlarged abdominal or pelvic lymph nodes. Reproductive: Uterus and bilateral adnexa are unremarkable. Other: Small amount of free fluid is identified in the right pelvis. Musculoskeletal: No acute abnormality. IMPRESSION:  1. Mild dilatation of the right renal pelvis without obstructing stone identified. This could be due to recently passed stone. 2. Small amount of free fluid identified in the right pelvis, likely physiologic. Electronically Signed   By: Sherian Rein M.D.   On: 05/08/2022 11:47    Microbiology: Results for orders placed or performed during the hospital encounter of 05/10/22  Urine Culture     Status: Abnormal   Collection Time: 05/10/22  2:40 PM   Specimen: Urine, Clean Catch  Result Value Ref Range Status   Specimen Description   Final    URINE, CLEAN CATCH Performed at [facility], [address], [place], [place] [address]    Special Requests   Final    NONE Performed at [facility],  [address], [place], [place] [address]    Culture >=100,000 COLONIES/mL ESCHERICHIA COLI (A)  Final   Report Status 05/12/2022 FINAL  Final   Organism ID, Bacteria ESCHERICHIA COLI (A)  Final      Susceptibility   Escherichia coli - MIC*    AMPICILLIN <=2 SENSITIVE Sensitive     CEFAZOLIN <=4 SENSITIVE Sensitive     CEFEPIME <=0.12 SENSITIVE Sensitive     CEFTRIAXONE <=0.25 SENSITIVE Sensitive     CIPROFLOXACIN <=0.25 SENSITIVE Sensitive     GENTAMICIN <=1 SENSITIVE Sensitive     IMIPENEM <=0.25 SENSITIVE Sensitive     NITROFURANTOIN <=16 SENSITIVE Sensitive     TRIMETH/SULFA >=320 RESISTANT Resistant     AMPICILLIN/SULBACTAM <=2 SENSITIVE Sensitive     PIP/TAZO <=4 SENSITIVE Sensitive     * >=100,000 COLONIES/mL ESCHERICHIA COLI  Blood culture (routine x 2)     Status: None (Preliminary result)   Collection Time: 05/10/22  4:08 PM   Specimen: BLOOD  Result Value Ref Range Status   Specimen Description   Final    BLOOD BLOOD LEFT HAND Performed at [facility], [address] [place], [place] [address]    Special Requests   Final    Blood Culture adequate volume BOTTLES DRAWN AEROBIC AND ANAEROBIC Performed at [facility], [address] [place], [place] [address]    Culture   Final    NO GROWTH 2 DAYS Performed at [facility], [address] [place], [place] [address]    Report Status PENDING  Incomplete  Blood culture (routine x 2)     Status: None (Preliminary result)   Collection Time: 05/10/22  4:08 PM   Specimen: BLOOD  Result Value Ref Range Status   Specimen Description   Final    BLOOD LEFT ANTECUBITAL Performed at [facility], [address], [place], [place] [address]    Special Requests   Final    Blood Culture adequate volume BOTTLES DRAWN AEROBIC AND ANAEROBIC Performed at [facility], [address] [place], [place] [address]    Culture   Final    NO  GROWTH 2 DAYS Performed at [facility], [address] [place], [place] [address]    Report Status PENDING  Incomplete   Labs: CBC: Recent Labs  Lab 05/10/22 1441 05/11/22 0420 05/12/22 0449  WBC 26.0* 29.1* 17.8*  NEUTROABS 22.1* 23.2* 12.4*  HGB 14.0 12.7 12.0  HCT 38.4 35.2* 34.4*  MCV 89.7 91.9 94.8  PLT 485* 448* 441*   Basic Metabolic Panel: Recent Labs  Lab 05/10/22 1441 05/11/22 0420 05/12/22 0449  NA 131* 138 142  K 3.2* 3.4* 4.4  CL  96* 108 112*  CO2 24 22 23   GLUCOSE 88 109* 98  BUN 11 9 6   CREATININE 0.90 0.74 0.66  CALCIUM 10.0 8.7* 9.0  MG  --  1.7 2.0  PHOS  --  2.6 3.3   Liver Function Tests: Recent Labs  Lab 05/11/22 0420 05/12/22 0449  AST 18 17  ALT 24 23  ALKPHOS 75 68  BILITOT 2.0* 1.5*  PROT 6.3* 6.2*  ALBUMIN 3.2* 3.2*   CBG: No results for input(s): "GLUCAP" in the last 168 hours.  Discharge time spent: greater than 30 minutes.  Signed: 05/13/22, DO [facility] 05/12/2022

## 2022-05-15 LAB — CULTURE, BLOOD (ROUTINE X 2)
Culture: NO GROWTH
Culture: NO GROWTH
Special Requests: ADEQUATE
Special Requests: ADEQUATE

## 2022-11-19 ENCOUNTER — Ambulatory Visit: Payer: 59 | Admitting: Urology

## 2022-11-27 ENCOUNTER — Other Ambulatory Visit: Payer: Self-pay | Admitting: Family Medicine

## 2022-11-27 DIAGNOSIS — M25521 Pain in right elbow: Secondary | ICD-10-CM

## 2022-12-04 ENCOUNTER — Ambulatory Visit
Admission: RE | Admit: 2022-12-04 | Discharge: 2022-12-04 | Disposition: A | Discharge status: Home / Self Care | Payer: 59 | Source: Ambulatory Visit | Attending: Family Medicine | Admitting: Family Medicine

## 2022-12-04 DIAGNOSIS — M25521 Pain in right elbow: Secondary | ICD-10-CM

## 2023-03-26 ENCOUNTER — Inpatient Hospital Stay (HOSPITAL_COMMUNITY)
Admission: AD | Admit: 2023-03-26 | Discharge: 2023-03-26 | Disposition: A | Discharge status: Home / Self Care | Payer: 59 | Attending: Obstetrics and Gynecology | Admitting: Obstetrics and Gynecology

## 2023-03-26 ENCOUNTER — Encounter (HOSPITAL_COMMUNITY): Payer: Self-pay | Admitting: *Deleted

## 2023-03-26 ENCOUNTER — Inpatient Hospital Stay (HOSPITAL_COMMUNITY): Payer: 59

## 2023-03-26 DIAGNOSIS — O99891 Other specified diseases and conditions complicating pregnancy: Secondary | ICD-10-CM

## 2023-03-26 DIAGNOSIS — Z885 Allergy status to narcotic agent status: Secondary | ICD-10-CM | POA: Diagnosis not present

## 2023-03-26 DIAGNOSIS — Y93E1 Activity, personal bathing and showering: Secondary | ICD-10-CM | POA: Diagnosis not present

## 2023-03-26 DIAGNOSIS — M549 Dorsalgia, unspecified: Secondary | ICD-10-CM | POA: Insufficient documentation

## 2023-03-26 DIAGNOSIS — Z87440 Personal history of urinary (tract) infections: Secondary | ICD-10-CM | POA: Diagnosis not present

## 2023-03-26 DIAGNOSIS — O26892 Other specified pregnancy related conditions, second trimester: Secondary | ICD-10-CM | POA: Insufficient documentation

## 2023-03-26 DIAGNOSIS — Z880 Allergy status to penicillin: Secondary | ICD-10-CM | POA: Diagnosis not present

## 2023-03-26 DIAGNOSIS — W182XXA Fall in (into) shower or empty bathtub, initial encounter: Secondary | ICD-10-CM | POA: Insufficient documentation

## 2023-03-26 DIAGNOSIS — Z3A19 19 weeks gestation of pregnancy: Secondary | ICD-10-CM | POA: Insufficient documentation

## 2023-03-26 HISTORY — DX: Tubulo-interstitial nephritis, not specified as acute or chronic: N12

## 2023-03-26 HISTORY — DX: Urinary tract infection, site not specified: N39.0

## 2023-03-26 HISTORY — DX: Calculus of kidney: N20.0

## 2023-03-26 LAB — CBC WITH DIFFERENTIAL/PLATELET
Abs Immature Granulocytes: 0.16 10*3/uL — ABNORMAL HIGH (ref 0.00–0.07)
Basophils Absolute: 0.1 10*3/uL (ref 0.0–0.1)
Basophils Relative: 1 %
Eosinophils Absolute: 0.1 10*3/uL (ref 0.0–0.5)
Eosinophils Relative: 1 %
HCT: 38.5 % (ref 36.0–46.0)
Hemoglobin: 13.6 g/dL (ref 12.0–15.0)
Immature Granulocytes: 1 %
Lymphocytes Relative: 18 %
Lymphs Abs: 3 10*3/uL (ref 0.7–4.0)
MCH: 33.8 pg (ref 26.0–34.0)
MCHC: 35.3 g/dL (ref 30.0–36.0)
MCV: 95.8 fL (ref 80.0–100.0)
Monocytes Absolute: 1.4 10*3/uL — ABNORMAL HIGH (ref 0.1–1.0)
Monocytes Relative: 9 %
Neutro Abs: 11.6 10*3/uL — ABNORMAL HIGH (ref 1.7–7.7)
Neutrophils Relative %: 70 %
Platelets: 573 10*3/uL — ABNORMAL HIGH (ref 150–400)
RBC: 4.02 MIL/uL (ref 3.87–5.11)
RDW: 12.7 % (ref 11.5–15.5)
WBC: 16.3 10*3/uL — ABNORMAL HIGH (ref 4.0–10.5)
nRBC: 0 % (ref 0.0–0.2)

## 2023-03-26 LAB — COMPREHENSIVE METABOLIC PANEL
ALT: 13 U/L (ref 0–44)
AST: 25 U/L (ref 15–41)
Albumin: 3.3 g/dL — ABNORMAL LOW (ref 3.5–5.0)
Alkaline Phosphatase: 53 U/L (ref 38–126)
Anion gap: 8 (ref 5–15)
BUN: 8 mg/dL (ref 6–20)
CO2: 21 mmol/L — ABNORMAL LOW (ref 22–32)
Calcium: 8.8 mg/dL — ABNORMAL LOW (ref 8.9–10.3)
Chloride: 106 mmol/L (ref 98–111)
Creatinine, Ser: 0.57 mg/dL (ref 0.44–1.00)
GFR, Estimated: >60 mL/min (ref >60)
Glucose, Bld: 73 mg/dL (ref 70–99)
Potassium: 4 mmol/L (ref 3.5–5.1)
Sodium: 135 mmol/L (ref 135–145)
Total Bilirubin: 1.4 mg/dL — ABNORMAL HIGH (ref 0.3–1.2)
Total Protein: 6.1 g/dL — ABNORMAL LOW (ref 6.5–8.1)

## 2023-03-26 LAB — URINALYSIS, ROUTINE W REFLEX MICROSCOPIC
Bilirubin Urine: NEGATIVE
Glucose, UA: NEGATIVE mg/dL
Ketones, ur: NEGATIVE mg/dL
Leukocytes,Ua: NEGATIVE
Nitrite: NEGATIVE
Protein, ur: NEGATIVE mg/dL
Specific Gravity, Urine: 1.02 (ref 1.005–1.030)
pH: 5 (ref 5.0–8.0)

## 2023-03-26 MED ORDER — CYCLOBENZAPRINE HCL 5 MG PO TABS
10.0000 mg | ORAL_TABLET | Freq: Once | ORAL | Status: AC
Start: 2023-03-26 — End: 2023-03-26
  Administered 2023-03-26: 10 mg via ORAL
  Filled 2023-03-26: qty 2

## 2023-03-26 MED ORDER — ACETAMINOPHEN 500 MG PO TABS
1000.0000 mg | ORAL_TABLET | Freq: Once | ORAL | Status: AC
Start: 2023-03-26 — End: 2023-03-26
  Administered 2023-03-26: 1000 mg via ORAL
  Filled 2023-03-26: qty 2

## 2023-03-26 MED ORDER — CYCLOBENZAPRINE HCL 10 MG PO TABS: 10.0000 mg | ORAL_TABLET | Freq: Three times a day (TID) | ORAL | 0 refills | Status: DC | PRN

## 2023-03-26 NOTE — MAU Note (Addendum)
Andrea Gaines is a 22 y.o. at [redacted]w[redacted]d here in MAU reporting: when she woke up this morning, she had swelling on rt lower back.  Has kind of eased off, the swelling has gone down, but the pain has come around to the front.  Has had some swelling in legs and feet.  'just doesn't feel right. Denies urinary problems.  No diarrhea, has had some issues with constipation, taking stool softener- helps.  Onset of complaint: this morning Pain score: 6 Vitals:   03/26/23 1610  BP: (!) 123/57  Pulse: 83  Resp: 16  Temp: 98 F (36.7 C)  SpO2: 100%     FHT:152 Lab orders placed from triage:  urine  Pt fell in shower Friday night, landed straight back on butt and back.

## 2023-03-26 NOTE — MAU Provider Note (Cosign Needed Addendum)
History     CSN: [id number]  Arrival date and time: 03/26/23 1507   Event Date/Time   First Provider Initiated Contact with Patient 03/26/23 1859      No chief complaint on file.  HPI Andrea Gaines is a 22 y.o. G1P0 at [redacted]w[redacted]d who presents to MAU for right sided back pain. She reports she woke up this morning with pain in the lower right side of her back. It has gradually worsened throughout the day and has now radiated into her lower abdomen. Nothing makes the pain better or worse, although she has not tried anything to relieve the pain. She denies urinary s/s, hematuria, nausea, vomiting, or diarrhea. No fever but reports she "got cold at work and that's usually how I can tell I have a fever". She has a history of pyelonephritis in the past but reports this pain feels different.   Of note, she also reports she fell in the shower on her tailbone and does not think this is the cause of her pain. She denies vaginal bleeding or leaking fluid. She reports normal fetal movement.  Patient receives [facility] at [facility], next appointment is scheduled on 6/5.  OB History     Gravida  1   Para      Term      Preterm      AB      Living         SAB      IAB      Ectopic      Multiple      Live Births              Past Medical History:  Diagnosis Date   Anxiety    Asthma    as child   Gallstones    Hereditary spherocytosis (HCC)    Kidney stones    Pyelonephritis    UTI (urinary tract infection)     Past Surgical History:  Procedure Laterality Date   CHOLECYSTECTOMY     SPLENECTOMY, TOTAL     TONSILLECTOMY     TYMPANOSTOMY TUBE PLACEMENT      Family History  Problem Relation Age of Onset   Hypertension Mother    Hereditary spherocytosis Mother    Hiatal hernia Mother    Healthy Father    Hereditary spherocytosis Sister    Asthma Sister    Asthma Brother    Kidney Stones Brother    Testicular cancer Maternal Uncle    Heart disease Maternal  Grandmother    Aneurysm Maternal Grandmother        brain   Diabetes Maternal Grandmother    Hypertension Maternal Grandmother    Heart disease Maternal Grandfather    Hypertension Maternal Grandfather    Brain cancer Paternal Grandmother    Prostate cancer Paternal Grandfather    Breast cancer Other        Maternal great aunt    Social History   Tobacco Use   Smoking status: Never   Smokeless tobacco: Never  Vaping Use   Vaping Use: Former  Substance Use Topics   Alcohol use: Not Currently    Comment: occasional   Drug use: No    Allergies:  Allergies  Allergen Reactions   Morphine Palpitations    Made Hot and pains in back   Augmentin [Amoxicillin-Pot Clavulanate] Diarrhea    Facility-Administered Medications Prior to Admission  Medication Dose Route Frequency Provider Last Rate Last Admin   diclofenac sodium (VOLTAREN)  1 % transdermal gel 2 g  2 g Topical TID PRN Cristie Hem, PA-C       Medications Prior to Admission  Medication Sig Dispense Refill Last Dose   acetaminophen (TYLENOL) 325 MG tablet Take 2 tablets (650 mg total) by mouth every 6 (six) hours as needed for mild pain, fever or headache. 20 tablet 0 03/24/2023   prenatal vitamin w/FE, FA (NATACHEW) 29-1 MG CHEW chewable tablet Chew 1 tablet by mouth daily at 12 noon. gummies   03/26/2023   busPIRone (BUSPAR) 7.5 MG tablet Take 7.5 mg by mouth daily as needed (anxiety).   More than a month   celecoxib (CELEBREX) 100 MG capsule Take 100 mg by mouth daily as needed for moderate pain.      FLOMAX 0.4 MG CAPS capsule Take 0.4 mg by mouth daily.      ondansetron (ZOFRAN) 4 MG tablet Take 1 tablet (4 mg total) by mouth every 8 (eight) hours as needed for nausea or vomiting. 20 tablet 0    polyethylene glycol (MIRALAX / GLYCOLAX) 17 g packet Take 17 g by mouth daily as needed for mild constipation. 14 each 0    traMADol (ULTRAM) 50 MG tablet Take 50-100 mg by mouth every 6 (six) hours as needed for severe pain.       TRI-SPRINTEC 0.18/0.215/0.25 MG-35 MCG tablet Take 1 tablet by mouth daily.      Review of Systems  Gastrointestinal:  Positive for abdominal pain and constipation.  Musculoskeletal:  Positive for back pain.  All other systems reviewed and are negative.   Physical Exam   Blood pressure (!) 118/59, pulse 74, temperature 98.1 F (36.7 C), resp. rate 18, height 5\' 9"  (1.753 m), weight 83.1 kg, SpO2 100 %.  Physical Exam Vitals and nursing note reviewed.  Constitutional:      General: She is not in acute distress. Eyes:     Extraocular Movements: Extraocular movements intact.     Pupils: Pupils are equal, round, and reactive to light.  Cardiovascular:     Rate and Rhythm: Normal rate.  Pulmonary:     Effort: Pulmonary effort is normal.  Abdominal:     Palpations: Abdomen is soft.     Tenderness: There is no abdominal tenderness. There is no right CVA tenderness or left CVA tenderness.  Musculoskeletal:        General: Normal range of motion.     Cervical back: Normal range of motion.  Skin:    General: Skin is warm and dry.  Neurological:     General: No focal deficit present.     Mental Status: She is alert and oriented to person, place, and time.  Psychiatric:        Mood and Affect: Mood normal.        Behavior: Behavior normal.    FHR: 152 bpm via doppler  Results for orders placed or performed during the hospital encounter of 03/26/23 (from the past 24 hour(s))  Urinalysis, Routine w reflex microscopic -Urine, Clean Catch     Status: Abnormal   Collection Time: 03/26/23  4:47 PM  Result Value Ref Range   Color, Urine YELLOW YELLOW   APPearance HAZY (A) CLEAR   Specific Gravity, Urine 1.020 1.005 - 1.030   pH 5.0 5.0 - 8.0   Glucose, UA NEGATIVE NEGATIVE mg/dL   Hgb urine dipstick SMALL (A) NEGATIVE   Bilirubin Urine NEGATIVE NEGATIVE   Ketones, ur NEGATIVE NEGATIVE mg/dL   Protein, ur  NEGATIVE NEGATIVE mg/dL   Nitrite NEGATIVE NEGATIVE   Leukocytes,Ua  NEGATIVE NEGATIVE   RBC / HPF 0-5 0 - 5 RBC/hpf   WBC, UA 0-5 0 - 5 WBC/hpf   Bacteria, UA RARE (A) NONE SEEN   Squamous Epithelial / HPF 6-10 0 - 5 /HPF   Mucus PRESENT   CBC with Differential/Platelet     Status: Abnormal   Collection Time: 03/26/23  7:21 PM  Result Value Ref Range   WBC 16.3 (H) 4.0 - 10.5 K/uL   RBC 4.02 3.87 - 5.11 MIL/uL   Hemoglobin 13.6 12.0 - 15.0 g/dL   HCT 16.1 09.6 - 04.5 %   MCV 95.8 80.0 - 100.0 fL   MCH 33.8 26.0 - 34.0 pg   MCHC 35.3 30.0 - 36.0 g/dL   RDW 40.9 81.1 - 91.4 %   Platelets 573 (H) 150 - 400 K/uL   nRBC 0.0 0.0 - 0.2 %   Neutrophils Relative % 70 %   Neutro Abs 11.6 (H) 1.7 - 7.7 K/uL   Lymphocytes Relative 18 %   Lymphs Abs 3.0 0.7 - 4.0 K/uL   Monocytes Relative 9 %   Monocytes Absolute 1.4 (H) 0.1 - 1.0 K/uL   Eosinophils Relative 1 %   Eosinophils Absolute 0.1 0.0 - 0.5 K/uL   Basophils Relative 1 %   Basophils Absolute 0.1 0.0 - 0.1 K/uL   Immature Granulocytes 1 %   Abs Immature Granulocytes 0.16 (H) 0.00 - 0.07 K/uL  Comprehensive metabolic panel     Status: Abnormal   Collection Time: 03/26/23  7:21 PM  Result Value Ref Range   Sodium 135 135 - 145 mmol/L   Potassium 4.0 3.5 - 5.1 mmol/L   Chloride 106 98 - 111 mmol/L   CO2 21 (L) 22 - 32 mmol/L   Glucose, Bld 73 70 - 99 mg/dL   BUN 8 6 - 20 mg/dL   Creatinine, Ser 7.82 0.44 - 1.00 mg/dL   Calcium 8.8 (L) 8.9 - 10.3 mg/dL   Total Protein 6.1 (L) 6.5 - 8.1 g/dL   Albumin 3.3 (L) 3.5 - 5.0 g/dL   AST 25 15 - 41 U/L   ALT 13 0 - 44 U/L   Alkaline Phosphatase 53 38 - 126 U/L   Total Bilirubin 1.4 (H) 0.3 - 1.2 mg/dL   GFR, Estimated >95 >62 mL/min   Anion gap 8 5 - 15    MAU Course  Procedures US RENAL  Result Date: 03/26/2023 CLINICAL DATA:  Right-sided flank pain for 1 day, known [redacted] weeks gestation EXAM: RENAL / URINARY TRACT ULTRASOUND COMPLETE COMPARISON:  None Available. FINDINGS: Right Kidney: Renal measurements: 11.7 x 5.7 x 5.7 cm. = volume: 197 mL.  Echogenicity within normal limits. No mass or hydronephrosis visualized. Left Kidney: Renal measurements: 11.9 x 4.0 x 4.8 cm. = volume: 119 mL. Echogenicity within normal limits. No mass or hydronephrosis visualized. Bladder: Appears normal for degree of bladder distention. Other: None. IMPRESSION: Normal-appearing kidneys.  No hydronephrosis is noted. Electronically Signed   By: Alcide Clever M.D.   On: 03/26/2023 21:24    MDM UA CBC, CMP Renal US Tylenol, Flexeril PO  UA with a small amount of blood and bacteria. Will add culture. Will send for renal ultrasound given presentation. Labs pending. Care handed over to E. Lyman Bishop, NP at 2010.  Brand Males, CNM 03/26/2023, 8:15 PM  Urine culture pending Pain resolved with tylenol & flexeril Patient has no CVA tenderness &  remained afebrile in MAU Assessment and Plan   1. Back pain affecting pregnancy in second trimester   2. [redacted] weeks gestation of pregnancy    -No signs of pyelo or nephrolithiasis at this time. Pain likely MSK as it resolved with tylenol & flexeril. Discussed with patient that if urine culture returns positive we will start her on abx. Also reviewed return precautions including worsening pain, fever, or vomiting.   Judeth Horn, NP

## 2023-03-26 NOTE — Progress Notes (Signed)
Patient reports fall in shower on Friday night, reports it was initially tender but pain is progressively worse. Pain is worse on right side, but reports she fell straight on tailbone, reports that she "felt fine" until today. Denies any urinary or fecal incontinence or constipation. Reports some swelling in legs, denies HA, blurred vision, epigastric pain.

## 2023-03-28 LAB — CULTURE, OB URINE: Culture: 40000 — AB

## 2023-04-28 ENCOUNTER — Inpatient Hospital Stay (HOSPITAL_BASED_OUTPATIENT_CLINIC_OR_DEPARTMENT_OTHER): Payer: 59

## 2023-04-28 ENCOUNTER — Inpatient Hospital Stay (HOSPITAL_COMMUNITY)
Admission: AD | Admit: 2023-04-28 | Discharge: 2023-04-28 | Disposition: A | Discharge status: Home / Self Care | Payer: 59 | Attending: Obstetrics and Gynecology | Admitting: Obstetrics and Gynecology

## 2023-04-28 ENCOUNTER — Other Ambulatory Visit: Payer: Self-pay

## 2023-04-28 ENCOUNTER — Encounter (HOSPITAL_COMMUNITY): Payer: Self-pay | Admitting: Obstetrics and Gynecology

## 2023-04-28 DIAGNOSIS — Z3A24 24 weeks gestation of pregnancy: Secondary | ICD-10-CM | POA: Diagnosis not present

## 2023-04-28 DIAGNOSIS — R1030 Lower abdominal pain, unspecified: Secondary | ICD-10-CM | POA: Insufficient documentation

## 2023-04-28 DIAGNOSIS — O4692 Antepartum hemorrhage, unspecified, second trimester: Secondary | ICD-10-CM | POA: Diagnosis not present

## 2023-04-28 DIAGNOSIS — O26892 Other specified pregnancy related conditions, second trimester: Secondary | ICD-10-CM | POA: Insufficient documentation

## 2023-04-28 DIAGNOSIS — O2342 Unspecified infection of urinary tract in pregnancy, second trimester: Secondary | ICD-10-CM | POA: Insufficient documentation

## 2023-04-28 DIAGNOSIS — M545 Low back pain, unspecified: Secondary | ICD-10-CM | POA: Insufficient documentation

## 2023-04-28 DIAGNOSIS — N390 Urinary tract infection, site not specified: Secondary | ICD-10-CM | POA: Insufficient documentation

## 2023-04-28 DIAGNOSIS — R109 Unspecified abdominal pain: Secondary | ICD-10-CM | POA: Diagnosis not present

## 2023-04-28 DIAGNOSIS — N898 Other specified noninflammatory disorders of vagina: Secondary | ICD-10-CM

## 2023-04-28 LAB — CBC WITH DIFFERENTIAL/PLATELET
Abs Immature Granulocytes: 0.32 10*3/uL — ABNORMAL HIGH (ref 0.00–0.07)
Basophils Absolute: 0.1 10*3/uL (ref 0.0–0.1)
Basophils Relative: 1 %
Eosinophils Absolute: 0.1 10*3/uL (ref 0.0–0.5)
Eosinophils Relative: 1 %
HCT: 36 % (ref 36.0–46.0)
Hemoglobin: 12.7 g/dL (ref 12.0–15.0)
Immature Granulocytes: 2 %
Lymphocytes Relative: 16 %
Lymphs Abs: 2.8 10*3/uL (ref 0.7–4.0)
MCH: 34 pg (ref 26.0–34.0)
MCHC: 35.3 g/dL (ref 30.0–36.0)
MCV: 96.5 fL (ref 80.0–100.0)
Monocytes Absolute: 1.4 10*3/uL — ABNORMAL HIGH (ref 0.1–1.0)
Monocytes Relative: 8 %
Neutro Abs: 12.6 10*3/uL — ABNORMAL HIGH (ref 1.7–7.7)
Neutrophils Relative %: 72 %
Platelets: 529 10*3/uL — ABNORMAL HIGH (ref 150–400)
RBC: 3.73 MIL/uL — ABNORMAL LOW (ref 3.87–5.11)
RDW: 12.8 % (ref 11.5–15.5)
WBC: 17.4 10*3/uL — ABNORMAL HIGH (ref 4.0–10.5)
nRBC: 0 % (ref 0.0–0.2)

## 2023-04-28 LAB — COMPREHENSIVE METABOLIC PANEL
ALT: 15 U/L (ref 0–44)
AST: 17 U/L (ref 15–41)
Albumin: 2.9 g/dL — ABNORMAL LOW (ref 3.5–5.0)
Alkaline Phosphatase: 62 U/L (ref 38–126)
Anion gap: 12 (ref 5–15)
BUN: 7 mg/dL (ref 6–20)
CO2: 19 mmol/L — ABNORMAL LOW (ref 22–32)
Calcium: 8.9 mg/dL (ref 8.9–10.3)
Chloride: 105 mmol/L (ref 98–111)
Creatinine, Ser: 0.52 mg/dL (ref 0.44–1.00)
GFR, Estimated: >60 mL/min (ref >60)
Glucose, Bld: 86 mg/dL (ref 70–99)
Potassium: 3.3 mmol/L — ABNORMAL LOW (ref 3.5–5.1)
Sodium: 136 mmol/L (ref 135–145)
Total Bilirubin: 1.6 mg/dL — ABNORMAL HIGH (ref 0.3–1.2)
Total Protein: 5.8 g/dL — ABNORMAL LOW (ref 6.5–8.1)

## 2023-04-28 LAB — URINALYSIS, ROUTINE W REFLEX MICROSCOPIC
Bilirubin Urine: NEGATIVE
Glucose, UA: NEGATIVE mg/dL
Ketones, ur: 5 mg/dL — AB
Nitrite: NEGATIVE
Protein, ur: 30 mg/dL — AB
Specific Gravity, Urine: 1.025 (ref 1.005–1.030)
Squamous Epithelial / HPF: >50 /HPF (ref 0–5)
pH: 5 (ref 5.0–8.0)

## 2023-04-28 LAB — WET PREP, GENITAL
Clue Cells Wet Prep HPF POC: NONE SEEN
Sperm: NONE SEEN
Trich, Wet Prep: NONE SEEN
WBC, Wet Prep HPF POC: <10 (ref <10)
Yeast Wet Prep HPF POC: NONE SEEN

## 2023-04-28 LAB — AMNISURE RUPTURE OF MEMBRANE (ROM) NOT AT ARMC: Amnisure ROM: NEGATIVE

## 2023-04-28 NOTE — MAU Note (Addendum)
Andrea Gaines is a 22 y.o. at [redacted]w[redacted]d here in MAU reporting: woke up from a nap around 1430 today and noticed her underwear were wet and when she wiped there was bloody mucus present. States she is having abdominal cramping. Lower abdominal pressure that comes and goes.  Denies any recent sexual intercourse. Denies pain with urination. States there is a vaginal odor but denies pain or itching. Reports not feeling fetal movement since yesterday. Audible fetal movement with external monitors. Patient states she does not feel movement.  Onset of complaint: 1430 Pain score: 4 Vitals:   04/28/23 1741  BP: 115/60  Pulse: 84  Resp: 18  Temp: 98.3 F (36.8 C)     FHT:147 Lab orders placed from triage:  Amnisure, UA

## 2023-04-28 NOTE — MAU Provider Note (Cosign Needed Addendum)
History     CSN: [id number]  Arrival date and time: 04/28/23 1715   Event Date/Time   First Provider Initiated Contact with Patient 04/28/23 1747      Chief Complaint  Patient presents with   Vaginal Bleeding   Contractions   Decreased Fetal Movement   HPI Andrea Gaines is a 22 y.o. G1P0 at [redacted]w[redacted]d who presents with chief complaint of  "bloody mucus" x 1 episode at 1430 today. She is not sure about ongoing vaginal bleeding. She has not needed to don a pad and is not seeing blood on her clothing.  Patient also reports lower abdominal cramping. Pain score is 4/10, she describes it as "weird". She is s/p treatment for recurrent UTIs. She denies dysuria, frequency, flank pain and fever.   During MAU encounter and immediately following MFM ultrasound, patient reports new onset low back pain. Pain is in the middle of her lower back .   Patient receives care with [facility].  OB History     Gravida  1   Para      Term      Preterm      AB      Living         SAB      IAB      Ectopic      Multiple      Live Births              Past Medical History:  Diagnosis Date   Anxiety    Asthma    as child   Gallstones    Hereditary spherocytosis (HCC)    Kidney stones    Pyelonephritis    UTI (urinary tract infection)     Past Surgical History:  Procedure Laterality Date   CHOLECYSTECTOMY     SPLENECTOMY, TOTAL     TONSILLECTOMY     TYMPANOSTOMY TUBE PLACEMENT      Family History  Problem Relation Age of Onset   Hypertension Mother    Hereditary spherocytosis Mother    Hiatal hernia Mother    Healthy Father    Hereditary spherocytosis Sister    Asthma Sister    Asthma Brother    Kidney Stones Brother    Testicular cancer Maternal Uncle    Heart disease Maternal Grandmother    Aneurysm Maternal Grandmother        brain   Diabetes Maternal Grandmother    Hypertension Maternal Grandmother    Heart disease Maternal Grandfather     Hypertension Maternal Grandfather    Brain cancer Paternal Grandmother    Prostate cancer Paternal Grandfather    Breast cancer Other        Maternal great aunt    Social History   Tobacco Use   Smoking status: Never   Smokeless tobacco: Never  Vaping Use   Vaping Use: Former  Substance Use Topics   Alcohol use: Not Currently    Comment: occasional   Drug use: No    Allergies:  Allergies  Allergen Reactions   Morphine Palpitations    Made Hot and pains in back   Augmentin [Amoxicillin-Pot Clavulanate] Diarrhea    Medications Prior to Admission  Medication Sig Dispense Refill Last Dose   cyclobenzaprine (FLEXERIL) 10 MG tablet Take 1 tablet (10 mg total) by mouth 3 (three) times daily as needed for muscle spasms. 30 tablet 0 Past Month   prenatal vitamin w/FE, FA (NATACHEW) 29-1 MG CHEW chewable tablet Chew 1  tablet by mouth daily at 12 noon. gummies   04/27/2023   acetaminophen (TYLENOL) 325 MG tablet Take 2 tablets (650 mg total) by mouth every 6 (six) hours as needed for mild pain, fever or headache. 20 tablet 0 Unknown   polyethylene glycol (MIRALAX / GLYCOLAX) 17 g packet Take 17 g by mouth daily as needed for mild constipation. 14 each 0 Unknown    Review of Systems  Gastrointestinal:  Positive for abdominal pain.  Genitourinary:  Positive for vaginal bleeding.  Musculoskeletal:  Positive for back pain.  All other systems reviewed and are negative.  Physical Exam   Blood pressure 115/60, pulse 84, temperature 98.3 F (36.8 C), temperature source Oral, resp. rate 18.  Physical Exam Vitals and nursing note reviewed. Exam conducted with a chaperone present.  Constitutional:      Appearance: Normal appearance.  Cardiovascular:     Rate and Rhythm: Normal rate and regular rhythm.     Pulses: Normal pulses.     Heart sounds: Normal heart sounds.  Pulmonary:     Effort: Pulmonary effort is normal.     Breath sounds: Normal breath sounds.  Abdominal:      Tenderness: There is no right CVA tenderness or left CVA tenderness.     Comments: Gravid  Genitourinary:    Comments: Pelvic exam: External genitalia normal, vaginal walls pink and well rugated, cervix visually closed, no lesions noted. No bleeding or evidence of recent bleeding visualized on sterile speculum exam.    Skin:    Capillary Refill: Capillary refill takes less than 2 seconds.  Neurological:     Mental Status: She is alert and oriented to person, place, and time.  Psychiatric:        Mood and Affect: Mood normal.        Behavior: Behavior normal.        Thought Content: Thought content normal.        Judgment: Judgment normal.     MAU Course  Procedures  MDM Orders Placed This Encounter  Procedures   Wet prep, genital   Culture, OB Urine   [place] MFM OB Limited   Amnisure rupture of membrane (rom)not at [facility]   Urinalysis, Routine w reflex microscopic -Urine, Clean Catch   Comprehensive metabolic panel   CBC with Differential/Platelet   Patient Vitals for the past 24 hrs:  BP Temp Temp src Pulse Resp Height Weight  04/28/23 1741 115/60 98.3 F (36.8 C) Oral 84 18 -- --  04/28/23 1734 -- -- -- -- -- 5\' 9"  (1.753 m) 81.2 kg   Results for orders placed or performed during the hospital encounter of 04/28/23 (from the past 24 hour(s))  Urinalysis, Routine w reflex microscopic -Urine, Clean Catch     Status: Abnormal   Collection Time: 04/28/23  5:44 PM  Result Value Ref Range   Color, Urine AMBER (A) YELLOW   APPearance CLOUDY (A) CLEAR   Specific Gravity, Urine 1.025 1.005 - 1.030   pH 5.0 5.0 - 8.0   Glucose, UA NEGATIVE NEGATIVE mg/dL   Hgb urine dipstick SMALL (A) NEGATIVE   Bilirubin Urine NEGATIVE NEGATIVE   Ketones, ur 5 (A) NEGATIVE mg/dL   Protein, ur 30 (A) NEGATIVE mg/dL   Nitrite NEGATIVE NEGATIVE   Leukocytes,Ua TRACE (A) NEGATIVE   RBC / HPF 6-10 0 - 5 RBC/hpf   WBC, UA 11-20 0 - 5 WBC/hpf   Bacteria, UA MANY (A) NONE SEEN   Squamous Epithelial  / HPF >  50 0 - 5 /HPF   Mucus PRESENT   Amnisure rupture of membrane (rom)not at [facility]     Status: None   Collection Time: 04/28/23  5:58 PM  Result Value Ref Range   Amnisure ROM NEGATIVE   Wet prep, genital     Status: None   Collection Time: 04/28/23  5:58 PM  Result Value Ref Range   Yeast Wet Prep HPF POC NONE SEEN NONE SEEN   Trich, Wet Prep NONE SEEN NONE SEEN   Clue Cells Wet Prep HPF POC NONE SEEN NONE SEEN   WBC, Wet Prep HPF POC <10 <10   Sperm NONE SEEN    Report given to Dorathy Daft, CNM who assumes care of patient at this time.  Clayton Bibles, MSA, MSN, CNM Certified Nurse Midwife, Faculty [facility] 04/28/23 8:06 PM   - WBC slightly elevated at 17.4 but CNM reviewed last 3 white counts and this appears to be patient baseline.  - Potassium slightly low. - Low suspicion for Pyelo. Patient recently worked up for kidney stones and normal.  -  Patient recently being treated for a UTI. UA reflexed to culture.  - Normal Spec exam. Low suspicion for preterm labor.  - Amnisure negative. Low suspicion SROM  - Wet prep normal  - GC pending upon discharge  - CNM offered pain medication and patient declined.  - Plan for discharge.   Assessment and Plan   1. Vaginal discharge during pregnancy in second trimester   2. Abdominal cramping   3. [redacted] weeks gestation of pregnancy   4. Urinary tract infection in mother during second trimester of pregnancy    - Reviewed results with patient and patients mother.  - CNM to notify patient of concerning urine culture results.  - Worsening signs and return precautions reviewed  - Patient discharged home in stable condition and may return to MAU as needed.   Niel Peretti Danella Deis) Suzie Portela, MSN, CNM  [facility]  04/28/2023 10:14 PM

## 2023-04-29 LAB — CULTURE, OB URINE

## 2023-04-29 LAB — GC/CHLAMYDIA PROBE AMP (~~LOC~~) NOT AT ARMC
Chlamydia: NEGATIVE
Comment: NEGATIVE
Comment: NORMAL
Neisseria Gonorrhea: NEGATIVE

## 2023-05-08 ENCOUNTER — Encounter (HOSPITAL_COMMUNITY): Payer: Self-pay | Admitting: Obstetrics and Gynecology

## 2023-05-08 ENCOUNTER — Inpatient Hospital Stay (HOSPITAL_COMMUNITY)
Admission: AD | Admit: 2023-05-08 | Discharge: 2023-05-08 | Disposition: A | Discharge status: Home / Self Care | Payer: 59 | Attending: Obstetrics and Gynecology | Admitting: Obstetrics and Gynecology

## 2023-05-08 DIAGNOSIS — Z0371 Encounter for suspected problem with amniotic cavity and membrane ruled out: Secondary | ICD-10-CM

## 2023-05-08 DIAGNOSIS — N939 Abnormal uterine and vaginal bleeding, unspecified: Secondary | ICD-10-CM | POA: Diagnosis present

## 2023-05-08 DIAGNOSIS — O4692 Antepartum hemorrhage, unspecified, second trimester: Secondary | ICD-10-CM | POA: Insufficient documentation

## 2023-05-08 DIAGNOSIS — Z3A25 25 weeks gestation of pregnancy: Secondary | ICD-10-CM | POA: Diagnosis not present

## 2023-05-08 LAB — URINALYSIS, ROUTINE W REFLEX MICROSCOPIC
Bilirubin Urine: NEGATIVE
Glucose, UA: NEGATIVE mg/dL
Ketones, ur: NEGATIVE mg/dL
Leukocytes,Ua: NEGATIVE
Nitrite: NEGATIVE
Protein, ur: NEGATIVE mg/dL
Specific Gravity, Urine: 1.012 (ref 1.005–1.030)
pH: 6 (ref 5.0–8.0)

## 2023-05-08 LAB — WET PREP, GENITAL
Clue Cells Wet Prep HPF POC: NONE SEEN
Sperm: NONE SEEN
Trich, Wet Prep: NONE SEEN
WBC, Wet Prep HPF POC: >=10 — AB (ref <10)
Yeast Wet Prep HPF POC: NONE SEEN

## 2023-05-08 LAB — RUPTURE OF MEMBRANE (ROM)PLUS: Rom Plus: NEGATIVE

## 2023-05-08 NOTE — MAU Note (Signed)
...  Andrea Gaines is a 22 y.o. at [redacted]w[redacted]d here in MAU reporting: VB, LOF, lower abdominal pressure, and vaginal pressure. She reports around 0900 she felt a gush of fluids and reports her underwear were soaked. She reports later on she went to the restroom around 1230 and reports she saw bright red vaginal bleeding with two pea sized blood clots noted on toilet paper. She reports she has continually felt lower abdominal pressure as well as vaginal pressure since being discharged from MAU on 6/16. She reports if she sits a certain way the pressure increases. She reports she is unsure of what is normal to experience in her pregnancy. +FM.  Patient has picture on her phone of her bleeding. Patient evaluated in MAU on 6/16 for bloody mucous.  Onset of complaint: 1230 - bleeding Pain score:  5/10 lower abdomen 5/10 vagina  FHT: 145 doppler Lab orders placed from triage: UA

## 2023-05-08 NOTE — Discharge Instructions (Signed)
Your exam today revealed that your membranes were not broken and there was no evidence of blood in your vagina at all.

## 2023-05-08 NOTE — MAU Provider Note (Signed)
History     CSN: [id number]  Arrival date and time: 05/08/23 1354   Event Date/Time   First Provider Initiated Contact with Patient 05/08/23 1455      Chief Complaint  Patient presents with   Vaginal Bleeding   Andrea Gaines is a 22 y.o. year old G1P0 female at [redacted]w[redacted]d weeks gestation who presents to MAU reporting VB, LOF, lower abdominal and vaginal pressure. She also reports a gush of fluid at 0900 that soaked her underwear today. She has a picture on her phone of BRB with small pea sized blood clots on toilet paper today at 1230. She reports she has continually felt lower abdominal and vaginally pressure since she was d/c'd from MAU on 04/28/2023. She reports that when she sits a certain way, it increases the the pressure. She reports (+) FM. She receives [facility] with [facility]; next appt is 05/20/2023. Her partner is present and contributing to the history taking.         OB History     Gravida  1   Para      Term      Preterm      AB      Living         SAB      IAB      Ectopic      Multiple      Live Births              Past Medical History:  Diagnosis Date   Anxiety    Asthma    as child   Gallstones    Hereditary spherocytosis (HCC)    Kidney stones    Pyelonephritis    UTI (urinary tract infection)     Past Surgical History:  Procedure Laterality Date   CHOLECYSTECTOMY     SPLENECTOMY, TOTAL     TONSILLECTOMY     TYMPANOSTOMY TUBE PLACEMENT      Family History  Problem Relation Age of Onset   Hypertension Mother    Hereditary spherocytosis Mother    Hiatal hernia Mother    Healthy Father    Hereditary spherocytosis Sister    Asthma Sister    Asthma Brother    Kidney Stones Brother    Testicular cancer Maternal Uncle    Heart disease Maternal Grandmother    Aneurysm Maternal Grandmother        brain   Diabetes Maternal Grandmother    Hypertension Maternal Grandmother    Heart disease Maternal Grandfather     Hypertension Maternal Grandfather    Brain cancer Paternal Grandmother    Prostate cancer Paternal Grandfather    Breast cancer Other        Maternal great aunt    Social History   Tobacco Use   Smoking status: Never   Smokeless tobacco: Never  Vaping Use   Vaping Use: Former  Substance Use Topics   Alcohol use: Not Currently    Comment: occasional   Drug use: No    Allergies:  Allergies  Allergen Reactions   Morphine Palpitations    Made Hot and pains in back   Augmentin [Amoxicillin-Pot Clavulanate] Diarrhea    Medications Prior to Admission  Medication Sig Dispense Refill Last Dose   cyclobenzaprine (FLEXERIL) 10 MG tablet Take 1 tablet (10 mg total) by mouth 3 (three) times daily as needed for muscle spasms. 30 tablet 0 Past Week   prenatal vitamin w/FE, FA (NATACHEW) 29-1 MG CHEW chewable  tablet Chew 1 tablet by mouth daily at 12 noon. gummies   05/08/2023   acetaminophen (TYLENOL) 325 MG tablet Take 2 tablets (650 mg total) by mouth every 6 (six) hours as needed for mild pain, fever or headache. 20 tablet 0    polyethylene glycol (MIRALAX / GLYCOLAX) 17 g packet Take 17 g by mouth daily as needed for mild constipation. 14 each 0     Review of Systems  Constitutional: Negative.   HENT: Negative.    Eyes: Negative.   Respiratory: Negative.    Cardiovascular: Negative.   Gastrointestinal: Negative.   Endocrine: Negative.   Genitourinary:  Positive for vaginal bleeding and vaginal discharge (gushed fluid at 0900 this AM).  Musculoskeletal: Negative.   Skin: Negative.   Allergic/Immunologic: Negative.   Neurological: Negative.   Hematological: Negative.   Psychiatric/Behavioral: Negative.     Physical Exam   Blood pressure 123/63, pulse 81, temperature 97.9 F (36.6 C), temperature source Oral, resp. rate 16, SpO2 96 %.  Physical Exam Vitals and nursing note reviewed. Exam conducted with a chaperone present.  Constitutional:      Appearance: Normal  appearance. She is normal weight.  Cardiovascular:     Rate and Rhythm: Normal rate.  Pulmonary:     Effort: Pulmonary effort is normal.  Abdominal:     Palpations: Abdomen is soft.  Genitourinary:    General: Normal vulva.     Comments: Pelvic exam: External genitalia normal, SE: vaginal walls pink and well rugated, cervix is smooth, pink, no lesions, moderate amt of thick, white vaginal d/c, NO evidence of VB -- WP and ROM Plus done, cervix not visualized, Uterus is non-tender, S=D. Dilation: Closed Effacement (%): Thick Cervical Position: Posterior Station: Ballotable Presentation: Undeterminable Exam by:: Carloyn Jaeger, CNM  Musculoskeletal:        General: Normal range of motion.  Skin:    General: Skin is warm and dry.  Neurological:     Mental Status: She is alert and oriented to person, place, and time.  Psychiatric:        Mood and Affect: Mood normal.        Behavior: Behavior normal.        Thought Content: Thought content normal.        Judgment: Judgment normal.  REACTIVE NST - FHR: 140 bpm / moderate variability / accels present / decels absent / TOCO: none  MAU Course  Procedures  MDM CCUA Wet Prep Rom Plus   Results for orders placed or performed during the hospital encounter of 05/08/23 (from the past 24 hour(s))  Wet prep, genital     Status: Abnormal   Collection Time: 05/08/23  3:12 PM   Specimen: Vaginal  Result Value Ref Range   Yeast Wet Prep HPF POC NONE SEEN NONE SEEN   Trich, Wet Prep NONE SEEN NONE SEEN   Clue Cells Wet Prep HPF POC NONE SEEN NONE SEEN   WBC, Wet Prep HPF POC >=10 (A) <10   Sperm NONE SEEN     Assessment and Plan  1. No leakage of amniotic fluid into vagina - Reassurance given that her water is not broken - Patient very upset that she "doesn't have any answers of why (she) is bleeding" - Explained there was no evidence of bleeding pelvic exam - Advised bleeding could be rectal or urinary - Patient adamant that she "know the  difference between my butt and my vagina"  2. [redacted] weeks gestation of pregnancy   -  Discharge patient - Keep scheduled appt with [facility] on 05/20/2023 - 1745: Patient refusing to be d/c' home requesting a second opinion for what is wrong with her. Orders given to Burnadette Peter, RN to call Dr. Elon Spanner. Care relinquished to Dr. Elon Spanner at this time.  Raelyn Mora, CNM 05/08/2023, 2:55 PM

## 2023-05-08 NOTE — MAU Note (Signed)
After speaking with dr leger pt requested a note for work and stated she was ready to leave. Discharge instructions given.

## 2023-05-08 NOTE — MAU Note (Signed)
Dawson,CNM had been in to speak with pt about results prior to discharge. When RN goes in with discharge papers pt is sitting in bed crying. States she wants a second opinion. States she is not mad she just wants someone to give her answers and she wants a second opinion. States she has been her twice to be evaluated for this complaint and no one can tell her what is wrong. Reassured pt we will get someone else to speak with her. Same reported to provider. Per provider RN will call dr leger. Call to dr leger and situation explained to dr leger. Dr leger requests to speak with the pt on the phone. Call transferred to portable phone and call taken in to pt .

## 2023-06-22 ENCOUNTER — Encounter (HOSPITAL_COMMUNITY): Payer: Self-pay | Admitting: Obstetrics and Gynecology

## 2023-06-22 ENCOUNTER — Inpatient Hospital Stay (HOSPITAL_COMMUNITY)
Admission: AD | Admit: 2023-06-22 | Discharge: 2023-06-23 | Disposition: A | Discharge status: Home / Self Care | Payer: 59 | Attending: Obstetrics and Gynecology | Admitting: Obstetrics and Gynecology

## 2023-06-22 DIAGNOSIS — O99891 Other specified diseases and conditions complicating pregnancy: Secondary | ICD-10-CM | POA: Insufficient documentation

## 2023-06-22 DIAGNOSIS — O368130 Decreased fetal movements, third trimester, not applicable or unspecified: Secondary | ICD-10-CM | POA: Insufficient documentation

## 2023-06-22 DIAGNOSIS — Z3689 Encounter for other specified antenatal screening: Secondary | ICD-10-CM | POA: Insufficient documentation

## 2023-06-22 DIAGNOSIS — M549 Dorsalgia, unspecified: Secondary | ICD-10-CM | POA: Diagnosis not present

## 2023-06-22 DIAGNOSIS — Z3A32 32 weeks gestation of pregnancy: Secondary | ICD-10-CM | POA: Diagnosis not present

## 2023-06-22 DIAGNOSIS — O4703 False labor before 37 completed weeks of gestation, third trimester: Secondary | ICD-10-CM | POA: Diagnosis present

## 2023-06-22 LAB — URINALYSIS, ROUTINE W REFLEX MICROSCOPIC
Bilirubin Urine: NEGATIVE
Glucose, UA: NEGATIVE mg/dL
Ketones, ur: NEGATIVE mg/dL
Leukocytes,Ua: NEGATIVE
Nitrite: NEGATIVE
Protein, ur: NEGATIVE mg/dL
Specific Gravity, Urine: 1.003 — ABNORMAL LOW (ref 1.005–1.030)
pH: 7 (ref 5.0–8.0)

## 2023-06-22 LAB — WET PREP, GENITAL
Clue Cells Wet Prep HPF POC: NONE SEEN
Sperm: NONE SEEN
Trich, Wet Prep: NONE SEEN
WBC, Wet Prep HPF POC: <10 (ref <10)
Yeast Wet Prep HPF POC: NONE SEEN

## 2023-06-22 NOTE — MAU Note (Signed)
.  Andrea Gaines is a 22 y.o. at [redacted]w[redacted]d here in MAU reporting: 1930 intermittent contractions-vaginal pressure Denies SROM, vaginal bleeding or bloody show. Denies complications with pregnancy Endorses + fetal movement  Onset of complaint: 1930 Pain score: 8 uterine - lower back Vitals:   06/22/23 2242  BP: 123/60  Pulse: 80  Resp: 16  Temp: 98 F (36.7 C)  SpO2: 99%     FHT:145bpm Lab orders placed from triage:  none

## 2023-06-22 NOTE — MAU Provider Note (Signed)
History     CSN: [id number]  Arrival date and time: 06/22/23 2222   Event Date/Time   First Provider Initiated Contact with Patient 06/22/23 2301      Chief Complaint  Patient presents with   Contractions   Back Pain   Andrea Gaines is a 22 y.o. G1P0 at [redacted]w[redacted]d who receives care at [facility].  Patient reports next appt Monday August 12th.  She presents today for contractions.  She reports contractions that occur q15-53min since 1930.  She denies vaginal bleeding and reports discharge that she thinks is her mucous plug stating it is sticky and colorless.  reports she hasn't felt baby move since earlier today.   Patient reports sexual activity on Wednesday with some discomfort d/t her hip.  She reports some discomfort in her right hip and describes it as "someone trying to pull my bones out."  She states this has been present since Tuesday.  She reports relief with massage and had no worsening factors.  She states the pain has now radiated to her back and it occurs in waves.  She rates it a 7-8/10 when the pain occurs, but is otherwise tolerable. She states the pain radiates to her abdomen when the waves occur.     OB History     Gravida  1   Para      Term      Preterm      AB      Living         SAB      IAB      Ectopic      Multiple      Live Births              Past Medical History:  Diagnosis Date   Anxiety    Asthma    as child   Gallstones    Hereditary spherocytosis (HCC)    Kidney stones    Pyelonephritis    UTI (urinary tract infection)     Past Surgical History:  Procedure Laterality Date   CHOLECYSTECTOMY     SPLENECTOMY, TOTAL     TONSILLECTOMY     TYMPANOSTOMY TUBE PLACEMENT      Family History  Problem Relation Age of Onset   Hypertension Mother    Hereditary spherocytosis Mother    Hiatal hernia Mother    Healthy Father    Hereditary spherocytosis Sister    Asthma Sister    Asthma Brother    Kidney Stones  Brother    Testicular cancer Maternal Uncle    Heart disease Maternal Grandmother    Aneurysm Maternal Grandmother        brain   Diabetes Maternal Grandmother    Hypertension Maternal Grandmother    Heart disease Maternal Grandfather    Hypertension Maternal Grandfather    Brain cancer Paternal Grandmother    Prostate cancer Paternal Grandfather    Breast cancer Other        Maternal great aunt    Social History   Tobacco Use   Smoking status: Never   Smokeless tobacco: Never  Vaping Use   Vaping status: Former  Substance Use Topics   Alcohol use: Not Currently    Comment: occasional   Drug use: No    Allergies:  Allergies  Allergen Reactions   Morphine Palpitations    Made Hot and pains in back   Augmentin [Amoxicillin-Pot Clavulanate] Diarrhea    Medications Prior to Admission  Medication  Sig Dispense Refill Last Dose   prenatal vitamin w/FE, FA (NATACHEW) 29-1 MG CHEW chewable tablet Chew 1 tablet by mouth daily at 12 noon. gummies   06/21/2023   acetaminophen (TYLENOL) 325 MG tablet Take 2 tablets (650 mg total) by mouth every 6 (six) hours as needed for mild pain, fever or headache. 20 tablet 0 Unknown   cyclobenzaprine (FLEXERIL) 10 MG tablet Take 1 tablet (10 mg total) by mouth 3 (three) times daily as needed for muscle spasms. 30 tablet 0 Unknown   polyethylene glycol (MIRALAX / GLYCOLAX) 17 g packet Take 17 g by mouth daily as needed for mild constipation. 14 each 0 Unknown    Review of Systems  Gastrointestinal:  Positive for abdominal pain (Cramping). Negative for nausea and vomiting.  Genitourinary:  Positive for vaginal discharge. Negative for difficulty urinating, dyspareunia, dysuria and vaginal bleeding.   Physical Exam   Blood pressure 123/60, pulse 80, temperature 98 F (36.7 C), temperature source Oral, resp. rate 16, height 5\' 9"  (1.753 m), weight 88.7 kg, SpO2 99%.  Physical Exam Vitals reviewed.  Constitutional:      Appearance: Normal  appearance.  HENT:     Head: Normocephalic and atraumatic.  Eyes:     Conjunctiva/sclera: Conjunctivae normal.  Cardiovascular:     Rate and Rhythm: Normal rate.  Pulmonary:     Effort: Pulmonary effort is normal. No respiratory distress.  Genitourinary:    Comments: Dilation: Closed Effacement (%): Thick Cervical Position: Posterior Exam by:: Rayen Palen,CNM  Musculoskeletal:        General: Normal range of motion.     Cervical back: Normal range of motion.  Skin:    General: Skin is warm and dry.  Neurological:     Mental Status: She is alert and oriented to person, place, and time.  Psychiatric:        Mood and Affect: Mood normal.        Behavior: Behavior normal.     Fetal Assessment 145 bpm, Mod Var, -Decels, +15x15 Accels Toco: No ctx graphed  MAU Course   Results for orders placed or performed during the hospital encounter of 06/22/23 (from the past 24 hour(s))  Wet prep, genital     Status: None   Collection Time: 06/22/23 10:47 PM  Result Value Ref Range   Yeast Wet Prep HPF POC NONE SEEN NONE SEEN   Trich, Wet Prep NONE SEEN NONE SEEN   Clue Cells Wet Prep HPF POC NONE SEEN NONE SEEN   WBC, Wet Prep HPF POC <10 <10   Sperm NONE SEEN   Urinalysis, Routine w reflex microscopic -Urine, Clean Catch     Status: Abnormal   Collection Time: 06/22/23 10:48 PM  Result Value Ref Range   Color, Urine STRAW (A) YELLOW   APPearance CLEAR CLEAR   Specific Gravity, Urine 1.003 (L) 1.005 - 1.030   pH 7.0 5.0 - 8.0   Glucose, UA NEGATIVE NEGATIVE mg/dL   Hgb urine dipstick SMALL (A) NEGATIVE   Bilirubin Urine NEGATIVE NEGATIVE   Ketones, ur NEGATIVE NEGATIVE mg/dL   Protein, ur NEGATIVE NEGATIVE mg/dL   Nitrite NEGATIVE NEGATIVE   Leukocytes,Ua NEGATIVE NEGATIVE   RBC / HPF 0-5 0 - 5 RBC/hpf   WBC, UA 0-5 0 - 5 WBC/hpf   Bacteria, UA RARE (A) NONE SEEN   Squamous Epithelial / HPF 0-5 0 - 5 /HPF   No results found.  MDM PE Labs: UA EFM Cultures: GC/CT, Wet  Prep Assessment and  Plan  22 year old G1P0  SIUP at 32.2 weeks Cat I FT Back Pain DFM  -POC Reviewed -Exam performed and findings discussed. -Informed that fFN can not be performed d/t recent sexual activity. -Cultures collected and pending.  -Reassured that cervix closed. -Patient offered and declines pain medication. -Will give heating pads. -Clicker given for detection of fetal movement. -NST reactive.  -Monitor and reassess.   Cherre Robins MSN, CNM 06/22/2023, 11:01 PM   Reassessment (12:08 AM) -Patient reports pain remains, but continues to cope without pain medication. Support given. -Informed that in setting of DFM will send for BPP.  Reviewed components. -Patient verbalizes understanding and without questions.   Reassessment (12:44 AM) -Preliminary BPP returns 8/8. -Provider to bedside. -Patient continues to report back pain, but declines medication. Reports she is coping well. -Encouraged to call primary office or return to MAU if symptoms worsen or with the onset of new symptoms. -Discharged to home in stable condition.  Cherre Robins MSN, CNM Advanced Practice Provider, [facility]

## 2023-06-23 ENCOUNTER — Inpatient Hospital Stay (HOSPITAL_BASED_OUTPATIENT_CLINIC_OR_DEPARTMENT_OTHER): Payer: 59

## 2023-06-23 DIAGNOSIS — O368130 Decreased fetal movements, third trimester, not applicable or unspecified: Secondary | ICD-10-CM | POA: Diagnosis not present

## 2023-06-23 DIAGNOSIS — O99891 Other specified diseases and conditions complicating pregnancy: Secondary | ICD-10-CM

## 2023-06-23 DIAGNOSIS — Z3A32 32 weeks gestation of pregnancy: Secondary | ICD-10-CM | POA: Diagnosis not present

## 2023-06-23 DIAGNOSIS — M549 Dorsalgia, unspecified: Secondary | ICD-10-CM

## 2023-06-24 LAB — GC/CHLAMYDIA PROBE AMP (~~LOC~~) NOT AT ARMC
Chlamydia: NEGATIVE
Comment: NEGATIVE
Comment: NORMAL
Neisseria Gonorrhea: NEGATIVE

## 2023-07-18 LAB — OB RESULTS CONSOLE GBS: GBS: NEGATIVE

## 2023-07-31 ENCOUNTER — Inpatient Hospital Stay (HOSPITAL_COMMUNITY)
Admission: AD | Admit: 2023-07-31 | Discharge: 2023-08-01 | Disposition: A | Discharge status: Home / Self Care | Payer: 59 | Attending: Obstetrics and Gynecology | Admitting: Obstetrics and Gynecology

## 2023-07-31 ENCOUNTER — Other Ambulatory Visit: Payer: Self-pay

## 2023-07-31 ENCOUNTER — Encounter (HOSPITAL_COMMUNITY): Payer: Self-pay | Admitting: Obstetrics and Gynecology

## 2023-07-31 DIAGNOSIS — Z0371 Encounter for suspected problem with amniotic cavity and membrane ruled out: Secondary | ICD-10-CM | POA: Insufficient documentation

## 2023-07-31 DIAGNOSIS — O99891 Other specified diseases and conditions complicating pregnancy: Secondary | ICD-10-CM | POA: Diagnosis not present

## 2023-07-31 DIAGNOSIS — N898 Other specified noninflammatory disorders of vagina: Secondary | ICD-10-CM | POA: Diagnosis not present

## 2023-07-31 DIAGNOSIS — Z3A38 38 weeks gestation of pregnancy: Secondary | ICD-10-CM | POA: Insufficient documentation

## 2023-07-31 NOTE — MAU Note (Signed)
.  Andrea Gaines is a 22 y.o. at [redacted]w[redacted]d here in MAU reporting: cramping pains all day, but around 1945 had lower back pain and since 2100 pain came around to lower abdomen and whole stomach feels tight - coming and going every 10 minutes. States she was squatting next to bed to help with pain and when she stood up had thick white/clear fluid come out. States she has not continued to leak any fluid. Denies VB. +FM.   Onset of complaint: 2100 Pain score: 8 Vitals:   07/31/23 2219  BP: 130/72  Pulse: 97  Resp: 17  Temp: 98.1 F (36.7 C)  SpO2: 97%     FHT:145 Lab orders placed from triage:  labor eval

## 2023-08-01 DIAGNOSIS — R1030 Lower abdominal pain, unspecified: Secondary | ICD-10-CM | POA: Diagnosis present

## 2023-08-01 DIAGNOSIS — M545 Low back pain, unspecified: Secondary | ICD-10-CM | POA: Diagnosis present

## 2023-08-01 DIAGNOSIS — N898 Other specified noninflammatory disorders of vagina: Secondary | ICD-10-CM | POA: Diagnosis not present

## 2023-08-01 DIAGNOSIS — Z3A38 38 weeks gestation of pregnancy: Secondary | ICD-10-CM | POA: Diagnosis not present

## 2023-08-01 DIAGNOSIS — R252 Cramp and spasm: Secondary | ICD-10-CM | POA: Diagnosis present

## 2023-08-01 DIAGNOSIS — Z0371 Encounter for suspected problem with amniotic cavity and membrane ruled out: Secondary | ICD-10-CM | POA: Diagnosis not present

## 2023-08-01 NOTE — MAU Provider Note (Signed)
S: Andrea Gaines is a 22 y.o. G1P0 at [redacted]w[redacted]d  who presents to MAU today complaining of cramping and ctx all day, and have been every 10 min since 2100. Notes leaking of fluid -- thick white/clear fluid while squatting next to bed earlier to help w pain relief. No longer leaking at time of presentation to MAU. No VB. Having good FM.  O: BP 107/76   Pulse 92   Temp 98.1 F (36.7 C) (Oral)   Resp 17   Ht 5\' 9"  (1.753 m)   Wt 89.8 kg   SpO2 98%   BMI 29.22 kg/m  GENERAL: Well-developed, well-nourished female in no acute distress.  HEAD: Normocephalic, atraumatic.  CHEST: Normal effort of breathing, regular heart rate ABDOMEN: Soft, nontender, gravid  Cervical exam:  Dilation: Closed Effacement (%): Thick Exam by:: Andrea Twitty RN   Fetal Monitoring: Baseline: 140 Variability: mod Accelerations: present Decelerations: absent Contractions: sporadic  Negative fern.   A/P: SIUP at [redacted]w[redacted]d  Membranes intact - Negative ferning, pt hx more consistent with physiologic discharge vs loss of mucus plug - Plan to d/c - Given labor precautions   Sundra Aland, MD 08/01/2023 4:24 AM

## 2023-08-13 ENCOUNTER — Inpatient Hospital Stay (HOSPITAL_COMMUNITY)
Admission: RE | Admit: 2023-08-13 | Discharge: 2023-08-15 | DRG: 788 | Disposition: A | Discharge status: Home / Self Care | Payer: 59 | Attending: Obstetrics and Gynecology | Admitting: Obstetrics and Gynecology

## 2023-08-13 ENCOUNTER — Other Ambulatory Visit: Payer: Self-pay

## 2023-08-13 ENCOUNTER — Inpatient Hospital Stay (HOSPITAL_COMMUNITY): Payer: 59 | Admitting: Anesthesiology

## 2023-08-13 ENCOUNTER — Encounter (HOSPITAL_COMMUNITY)
Admission: RE | Disposition: A | Discharge status: Home / Self Care | Payer: Self-pay | Source: Home / Self Care | Attending: Obstetrics and Gynecology

## 2023-08-13 ENCOUNTER — Encounter (HOSPITAL_COMMUNITY): Payer: Self-pay | Admitting: Obstetrics and Gynecology

## 2023-08-13 DIAGNOSIS — Z825 Family history of asthma and other chronic lower respiratory diseases: Secondary | ICD-10-CM

## 2023-08-13 DIAGNOSIS — D580 Hereditary spherocytosis: Secondary | ICD-10-CM | POA: Diagnosis present

## 2023-08-13 DIAGNOSIS — Z3A39 39 weeks gestation of pregnancy: Secondary | ICD-10-CM

## 2023-08-13 DIAGNOSIS — Z8249 Family history of ischemic heart disease and other diseases of the circulatory system: Secondary | ICD-10-CM | POA: Diagnosis not present

## 2023-08-13 DIAGNOSIS — Z833 Family history of diabetes mellitus: Secondary | ICD-10-CM | POA: Diagnosis not present

## 2023-08-13 DIAGNOSIS — Z9081 Acquired absence of spleen: Secondary | ICD-10-CM

## 2023-08-13 DIAGNOSIS — O26893 Other specified pregnancy related conditions, third trimester: Secondary | ICD-10-CM | POA: Diagnosis present

## 2023-08-13 DIAGNOSIS — Z87442 Personal history of urinary calculi: Secondary | ICD-10-CM | POA: Diagnosis not present

## 2023-08-13 DIAGNOSIS — Z87440 Personal history of urinary (tract) infections: Secondary | ICD-10-CM | POA: Diagnosis not present

## 2023-08-13 DIAGNOSIS — Z3490 Encounter for supervision of normal pregnancy, unspecified, unspecified trimester: Principal | ICD-10-CM

## 2023-08-13 LAB — RPR: RPR Ser Ql: NONREACTIVE

## 2023-08-13 LAB — CBC
HCT: 34.8 % — ABNORMAL LOW (ref 36.0–46.0)
Hemoglobin: 12.1 g/dL (ref 12.0–15.0)
MCH: 30.5 pg (ref 26.0–34.0)
MCHC: 34.8 g/dL (ref 30.0–36.0)
MCV: 87.7 fL (ref 80.0–100.0)
Platelets: 579 10*3/uL — ABNORMAL HIGH (ref 150–400)
RBC: 3.97 MIL/uL (ref 3.87–5.11)
RDW: 13.1 % (ref 11.5–15.5)
WBC: 16.8 10*3/uL — ABNORMAL HIGH (ref 4.0–10.5)
nRBC: 0.1 % (ref 0.0–0.2)

## 2023-08-13 LAB — HIV ANTIBODY (ROUTINE TESTING W REFLEX): HIV Screen 4th Generation wRfx: NONREACTIVE

## 2023-08-13 LAB — TYPE AND SCREEN
ABO/RH(D): A POS
Antibody Screen: NEGATIVE

## 2023-08-13 SURGERY — Surgical Case
Anesthesia: Spinal

## 2023-08-13 MED ORDER — MISOPROSTOL 25 MCG QUARTER TABLET
25.0000 ug | ORAL_TABLET | ORAL | Status: DC | PRN
  Administered 2023-08-13 (×3): 25 ug via VAGINAL
  Filled 2023-08-13 (×3): qty 1

## 2023-08-13 MED ORDER — DIPHENHYDRAMINE HCL 25 MG PO CAPS
25.0000 mg | ORAL_CAPSULE | Freq: Four times a day (QID) | ORAL | Status: DC | PRN
  Administered 2023-08-13: 25 mg via ORAL
  Filled 2023-08-13: qty 1

## 2023-08-13 MED ORDER — DIBUCAINE (PERIANAL) 1 % EX OINT: 1.0000 | TOPICAL_OINTMENT | CUTANEOUS | Status: DC | PRN

## 2023-08-13 MED ORDER — MENTHOL 3 MG MT LOZG: 1.0000 | LOZENGE | OROMUCOSAL | Status: DC | PRN

## 2023-08-13 MED ORDER — OXYTOCIN-SODIUM CHLORIDE 30-0.9 UT/500ML-% IV SOLN: 1.0000 m[IU]/min | INTRAVENOUS | Status: DC

## 2023-08-13 MED ORDER — IBUPROFEN 600 MG PO TABS
600.0000 mg | ORAL_TABLET | Freq: Four times a day (QID) | ORAL | Status: DC
  Administered 2023-08-14 – 2023-08-15 (×3): 600 mg via ORAL
  Filled 2023-08-13 (×3): qty 1

## 2023-08-13 MED ORDER — OXYCODONE HCL 5 MG PO TABS
5.0000 mg | ORAL_TABLET | ORAL | Status: DC | PRN
  Administered 2023-08-15: 5 mg via ORAL
  Filled 2023-08-13: qty 1

## 2023-08-13 MED ORDER — OXYCODONE-ACETAMINOPHEN 5-325 MG PO TABS: 2.0000 | ORAL_TABLET | ORAL | Status: DC | PRN

## 2023-08-13 MED ORDER — TERBUTALINE SULFATE 1 MG/ML IJ SOLN: 0.2500 mg | Freq: Once | INTRAMUSCULAR | Status: DC | PRN

## 2023-08-13 MED ORDER — KETOROLAC TROMETHAMINE 30 MG/ML IJ SOLN: 30.0000 mg | Freq: Four times a day (QID) | INTRAMUSCULAR | Status: DC | PRN

## 2023-08-13 MED ORDER — HYDROXYZINE HCL 50 MG PO TABS: 50.0000 mg | ORAL_TABLET | Freq: Four times a day (QID) | ORAL | Status: DC | PRN

## 2023-08-13 MED ORDER — LACTATED RINGERS IV SOLN: INTRAVENOUS | Status: DC

## 2023-08-13 MED ORDER — OXYTOCIN-SODIUM CHLORIDE 30-0.9 UT/500ML-% IV SOLN
INTRAVENOUS | Status: DC | PRN
  Administered 2023-08-13: 300 mL via INTRAVENOUS

## 2023-08-13 MED ORDER — LACTATED RINGERS IV SOLN
INTRAVENOUS | Status: DC | PRN
Start: 2023-08-13 — End: 2023-08-13

## 2023-08-13 MED ORDER — PHENYLEPHRINE HCL-NACL 20-0.9 MG/250ML-% IV SOLN
INTRAVENOUS | Status: DC | PRN
  Administered 2023-08-13: 60 ug/min via INTRAVENOUS

## 2023-08-13 MED ORDER — SIMETHICONE 80 MG PO CHEW: 80.0000 mg | CHEWABLE_TABLET | ORAL | Status: DC | PRN

## 2023-08-13 MED ORDER — MORPHINE SULFATE (PF) 0.5 MG/ML IJ SOLN
INTRAMUSCULAR | Status: AC
  Filled 2023-08-13: qty 10

## 2023-08-13 MED ORDER — SIMETHICONE 80 MG PO CHEW
80.0000 mg | CHEWABLE_TABLET | Freq: Three times a day (TID) | ORAL | Status: DC
  Administered 2023-08-14 – 2023-08-15 (×4): 80 mg via ORAL
  Filled 2023-08-13 (×5): qty 1

## 2023-08-13 MED ORDER — SENNOSIDES-DOCUSATE SODIUM 8.6-50 MG PO TABS
2.0000 | ORAL_TABLET | Freq: Every day | ORAL | Status: DC
  Administered 2023-08-14 – 2023-08-15 (×2): 2 via ORAL
  Filled 2023-08-13 (×2): qty 2

## 2023-08-13 MED ORDER — FENTANYL CITRATE (PF) 100 MCG/2ML IJ SOLN
INTRAMUSCULAR | Status: AC
  Filled 2023-08-13: qty 2

## 2023-08-13 MED ORDER — OXYTOCIN BOLUS FROM INFUSION: 333.0000 mL | Freq: Once | INTRAVENOUS | Status: DC

## 2023-08-13 MED ORDER — PHENYLEPHRINE 80 MCG/ML (10ML) SYRINGE FOR IV PUSH (FOR BLOOD PRESSURE SUPPORT): PREFILLED_SYRINGE | INTRAVENOUS | Status: DC | PRN

## 2023-08-13 MED ORDER — OXYCODONE-ACETAMINOPHEN 5-325 MG PO TABS: 1.0000 | ORAL_TABLET | ORAL | Status: DC | PRN

## 2023-08-13 MED ORDER — HYDROMORPHONE HCL 1 MG/ML IJ SOLN: 0.2000 mg | INTRAMUSCULAR | Status: DC | PRN

## 2023-08-13 MED ORDER — COCONUT OIL OIL
1.0000 | TOPICAL_OIL | Status: DC | PRN
  Administered 2023-08-14: 1 via TOPICAL

## 2023-08-13 MED ORDER — PRENATAL MULTIVITAMIN CH
1.0000 | ORAL_TABLET | Freq: Every day | ORAL | Status: DC
  Administered 2023-08-14 – 2023-08-15 (×2): 1 via ORAL
  Filled 2023-08-13 (×2): qty 1

## 2023-08-13 MED ORDER — ZOLPIDEM TARTRATE 5 MG PO TABS: 5.0000 mg | ORAL_TABLET | Freq: Every evening | ORAL | Status: DC | PRN

## 2023-08-13 MED ORDER — ONDANSETRON HCL 4 MG/2ML IJ SOLN
INTRAMUSCULAR | Status: DC | PRN
  Administered 2023-08-13: 4 mg via INTRAVENOUS

## 2023-08-13 MED ORDER — OXYTOCIN-SODIUM CHLORIDE 30-0.9 UT/500ML-% IV SOLN: 2.5000 [IU]/h | INTRAVENOUS | Status: DC

## 2023-08-13 MED ORDER — KETOROLAC TROMETHAMINE 30 MG/ML IJ SOLN
30.0000 mg | Freq: Four times a day (QID) | INTRAMUSCULAR | Status: AC
  Administered 2023-08-13 – 2023-08-14 (×3): 30 mg via INTRAVENOUS
  Filled 2023-08-13 (×4): qty 1

## 2023-08-13 MED ORDER — LACTATED RINGERS IV SOLN
500.0000 mL | INTRAVENOUS | Status: DC | PRN
  Administered 2023-08-13: 500 mL via INTRAVENOUS

## 2023-08-13 MED ORDER — ONDANSETRON HCL 4 MG/2ML IJ SOLN: 4.0000 mg | Freq: Four times a day (QID) | INTRAMUSCULAR | Status: DC | PRN

## 2023-08-13 MED ORDER — MORPHINE SULFATE (PF) 0.5 MG/ML IJ SOLN
INTRAMUSCULAR | Status: DC | PRN
  Administered 2023-08-13: 150 ug via INTRATHECAL

## 2023-08-13 MED ORDER — ACETAMINOPHEN 10 MG/ML IV SOLN
INTRAVENOUS | Status: DC | PRN
Start: 2023-08-13 — End: 2023-08-13
  Administered 2023-08-13: 1000 mg via INTRAVENOUS

## 2023-08-13 MED ORDER — ACETAMINOPHEN 325 MG PO TABS: 650.0000 mg | ORAL_TABLET | ORAL | Status: DC | PRN

## 2023-08-13 MED ORDER — DEXAMETHASONE SODIUM PHOSPHATE 10 MG/ML IJ SOLN
INTRAMUSCULAR | Status: DC | PRN
  Administered 2023-08-13: 10 mg via INTRAVENOUS

## 2023-08-13 MED ORDER — LIDOCAINE HCL (PF) 1 % IJ SOLN: 30.0000 mL | INTRAMUSCULAR | Status: DC | PRN

## 2023-08-13 MED ORDER — NITROGLYCERIN 0.4 MG/SPRAY TL SOLN
Status: DC | PRN
Start: 2023-08-13 — End: 2023-08-13
  Administered 2023-08-13: 4 via SUBLINGUAL

## 2023-08-13 MED ORDER — SOD CITRATE-CITRIC ACID 500-334 MG/5ML PO SOLN
30.0000 mL | ORAL | Status: DC | PRN
  Administered 2023-08-13: 30 mL via ORAL
  Filled 2023-08-13 (×2): qty 30

## 2023-08-13 MED ORDER — BUPIVACAINE IN DEXTROSE 0.75-8.25 % IT SOLN
INTRATHECAL | Status: DC | PRN
  Administered 2023-08-13: 1.6 mL via INTRATHECAL

## 2023-08-13 MED ORDER — FENTANYL CITRATE (PF) 100 MCG/2ML IJ SOLN
INTRAMUSCULAR | Status: DC | PRN
  Administered 2023-08-13: 15 ug via INTRATHECAL

## 2023-08-13 MED ORDER — ACETAMINOPHEN 500 MG PO TABS
1000.0000 mg | ORAL_TABLET | Freq: Four times a day (QID) | ORAL | Status: DC
  Administered 2023-08-14 – 2023-08-15 (×6): 1000 mg via ORAL
  Filled 2023-08-13 (×6): qty 2

## 2023-08-13 MED ORDER — FENTANYL CITRATE (PF) 100 MCG/2ML IJ SOLN
50.0000 ug | INTRAMUSCULAR | Status: DC | PRN
  Administered 2023-08-13 (×2): 50 ug via INTRAVENOUS
  Administered 2023-08-13: 100 ug via INTRAVENOUS
  Filled 2023-08-13 (×3): qty 2

## 2023-08-13 MED ORDER — WITCH HAZEL-GLYCERIN EX PADS: 1.0000 | MEDICATED_PAD | CUTANEOUS | Status: DC | PRN

## 2023-08-13 MED ORDER — OXYTOCIN-SODIUM CHLORIDE 30-0.9 UT/500ML-% IV SOLN: 2.5000 [IU]/h | INTRAVENOUS | Status: AC

## 2023-08-13 MED ORDER — CEFAZOLIN SODIUM-DEXTROSE 2-3 GM-%(50ML) IV SOLR
INTRAVENOUS | Status: DC | PRN
Start: 2023-08-13 — End: 2023-08-13
  Administered 2023-08-13: 2 g via INTRAVENOUS

## 2023-08-13 SURGICAL SUPPLY — 33 items
APL PRP STRL LF DISP 70% ISPRP (MISCELLANEOUS) ×2
APL SKNCLS STERI-STRIP NONHPOA (GAUZE/BANDAGES/DRESSINGS) ×1
BENZOIN TINCTURE PRP APPL 2/3 (GAUZE/BANDAGES/DRESSINGS) ×1 IMPLANT
CHLORAPREP W/TINT 26 (MISCELLANEOUS) ×2 IMPLANT
CLAMP UMBILICAL CORD (MISCELLANEOUS) ×1 IMPLANT
CLOTH BEACON ORANGE TIMEOUT ST (SAFETY) ×1 IMPLANT
CLSR STERI-STRIP ANTIMIC 1/2X4 (GAUZE/BANDAGES/DRESSINGS) ×1 IMPLANT
DRSG OPSITE POSTOP 4X10 (GAUZE/BANDAGES/DRESSINGS) ×1 IMPLANT
ELECT REM PT RETURN 9FT ADLT (ELECTROSURGICAL) ×1
ELECTRODE REM PT RTRN 9FT ADLT (ELECTROSURGICAL) ×1 IMPLANT
EXTRACTOR VACUUM KIWI (MISCELLANEOUS) IMPLANT
GLOVE BIO SURGEON STRL SZ 6.5 (GLOVE) ×1 IMPLANT
GLOVE BIOGEL PI IND STRL 6.5 (GLOVE) ×1 IMPLANT
GLOVE BIOGEL PI IND STRL 7.0 (GLOVE) ×2 IMPLANT
GOWN STRL REUS W/TWL LRG LVL3 (GOWN DISPOSABLE) ×2 IMPLANT
KIT ABG SYR 3ML LUER SLIP (SYRINGE) ×1 IMPLANT
NDL HYPO 25X5/8 SAFETYGLIDE (NEEDLE) ×1 IMPLANT
NEEDLE HYPO 25X5/8 SAFETYGLIDE (NEEDLE) ×2 IMPLANT
NS IRRIG 1000ML POUR BTL (IV SOLUTION) ×1 IMPLANT
PACK C SECTION WH (CUSTOM PROCEDURE TRAY) ×1 IMPLANT
PAD OB MATERNITY 4.3X12.25 (PERSONAL CARE ITEMS) ×1 IMPLANT
STRIP CLOSURE SKIN 1/2X4 (GAUZE/BANDAGES/DRESSINGS) IMPLANT
SUT PLAIN 0 NONE (SUTURE) IMPLANT
SUT PLAIN 2 0 (SUTURE) ×1
SUT PLAIN ABS 2-0 CT1 27XMFL (SUTURE) ×1 IMPLANT
SUT VIC AB 0 CT1 36 (SUTURE) ×1 IMPLANT
SUT VIC AB 0 CTX 36 (SUTURE) ×2
SUT VIC AB 0 CTX36XBRD ANBCTRL (SUTURE) ×2 IMPLANT
SUT VIC AB 4-0 PS2 27 (SUTURE) ×1 IMPLANT
SYR 3ML 23GX1 SAFETY (SYRINGE) IMPLANT
TOWEL OR 17X24 6PK STRL BLUE (TOWEL DISPOSABLE) ×1 IMPLANT
TRAY FOLEY W/BAG SLVR 14FR LF (SET/KITS/TRAYS/PACK) IMPLANT
WATER STERILE IRR 1000ML POUR (IV SOLUTION) ×1 IMPLANT

## 2023-08-13 NOTE — Lactation Note (Addendum)
This note was copied from a baby's chart. Lactation Consultation Note  Patient Name: Andrea Gaines Date: 08/13/2023 Age:22 Reason for consult: Initial assessment;1st time breastfeeding;Term (C/S delivery).  P1, term female infant, Birth Parent  did reverse pressure soften prior to latched infant on her right breast using the football hold position, infant sustained latch with depth and was still breast feeding after 11 minutes when LC left the room. LC discussed infant's input and output, infant had two stools since birth. Per Birth Parent, she was told she has inverted nipple in recovery, after infant did not latch so she gave infant formula. LC informed Birth Parent she does not have inverted nipple, she plans to continue to exclusively BF infant for now .  Birth Parent was made aware of O/P services, breastfeeding support groups, community resources, and our phone # for post-discharge questions.    Current feeding plan:  1- Birth Parent will continue to do reverse pressure soften prior to latching infant at breast,  BF infant by cues, on demand, every 2-3 hours, skin to skin. 2- Birth Parent will ask RN/LC for latch assistance if needed. 3- Birth Parent will stay hydrate, have maternal rest and eat balance meals and snacks.  Maternal Data Has patient been taught Hand Expression?: (P) Yes Does the patient have breastfeeding experience prior to this delivery?: No  Feeding Mother's Current Feeding Choice: Breast Milk and Formula Nipple Type: Slow - flow  LATCH Score Latch: Grasps breast easily, tongue down, lips flanged, rhythmical sucking.  Audible Swallowing: Spontaneous and intermittent  Type of Nipple: Everted at rest and after stimulation  Comfort (Breast/Nipple): Soft / non-tender  Hold (Positioning): Assistance needed to correctly position infant at breast and maintain latch.  LATCH Score: 9   Lactation Tools Discussed/Used     Interventions Interventions: Breast feeding basics reviewed;Assisted with latch;Skin to skin;Breast compression;Adjust position;Support pillows;Reverse pressure;Position options;Education;LC Services brochure  Discharge Pump: DEBP;Personal  Consult Status Consult Status: Follow-up Date: 08/14/23 Follow-up type: In-patient    Andrea Gaines 08/13/2023, 11:45 PM

## 2023-08-13 NOTE — Anesthesia Procedure Notes (Signed)
Spinal  Patient location during procedure: OR Start time: 08/13/2023 7:58 PM End time: 08/13/2023 8:01 PM Reason for block: surgical anesthesia Staffing Performed: anesthesiologist  Anesthesiologist: Kaylyn Layer, MD Performed by: Kaylyn Layer, MD Authorized by: Kaylyn Layer, MD   Preanesthetic Checklist Completed: patient identified, IV checked, risks and benefits discussed, monitors and equipment checked, pre-op evaluation and timeout performed Spinal Block Patient position: sitting Prep: DuraPrep and site prepped and draped Patient monitoring: heart rate, continuous pulse ox and blood pressure Approach: midline Location: L3-4 Injection technique: single-shot Needle Needle type: Pencan  Needle gauge: 24 G Needle length: 10 cm Assessment Sensory level: T4 Events: CSF return Additional Notes Risks, benefits, and alternative discussed. Patient gave consent to procedure. Prepped and draped in sitting position. Clear CSF obtained after one needle redirection. Positive terminal aspiration. No pain or paraesthesias with injection. Patient tolerated procedure well. Vital signs stable. Amalia Greenhouse, MD

## 2023-08-13 NOTE — Progress Notes (Signed)
Has had two prolonged decels with recovery after a few minutes. Patient remains comfortable and pitocin has not been started. I do not believe the baby will tolerate labor given 1cm, remote from delivery, and has now had multiple decelerations. I recommend proceeding with primary cesarean section. Risks discussed including infection, bleeding, damage to surrounding structures, the need for additional procedures including hysterectomy, and the possibility of uterine rupture with neonatal morbidity/mortality, scarring, and abnormal placentation with subsequent pregnancies. Patient agrees to proceed. 2g ancef on call to OR.    Rosie Fate MD

## 2023-08-13 NOTE — Anesthesia Preprocedure Evaluation (Signed)
Anesthesia Evaluation  Patient identified by MRN, date of birth, ID band Patient awake    Reviewed: Allergy & Precautions, NPO status , Patient's Chart, lab work & pertinent test results  History of Anesthesia Complications Negative for: history of anesthetic complications  Airway Mallampati: II  TM Distance: >3 FB Neck ROM: Full    Dental no notable dental hx.    Pulmonary asthma    Pulmonary exam normal        Cardiovascular negative cardio ROS Normal cardiovascular exam     Neuro/Psych   Anxiety     negative neurological ROS     GI/Hepatic negative GI ROS, Neg liver ROS,,,  Endo/Other  negative endocrine ROS    Renal/GU negative Renal ROS  negative genitourinary   Musculoskeletal negative musculoskeletal ROS (+)    Abdominal   Peds  Hematology Hereditary spherocytosis s/p splenectomy   Anesthesia Other Findings Day of surgery medications reviewed with patient.  Reproductive/Obstetrics (+) Pregnancy                             Anesthesia Physical Anesthesia Plan  ASA: 2 and emergent  Anesthesia Plan: Spinal   Post-op Pain Management: Ofirmev IV (intra-op)*   Induction:   PONV Risk Score and Plan: 4 or greater and Treatment may vary due to age or medical condition, Ondansetron and Dexamethasone  Airway Management Planned: Natural Airway  Additional Equipment: None  Intra-op Plan:   Post-operative Plan:   Informed Consent: I have reviewed the patients History and Physical, chart, labs and discussed the procedure including the risks, benefits and alternatives for the proposed anesthesia with the patient or authorized representative who has indicated his/her understanding and acceptance.       Plan Discussed with: CRNA  Anesthesia Plan Comments: (Urgent C/S called for NRFHT. Plan for spinal anesthesia. Stephannie Peters, MD)        Anesthesia Quick Evaluation

## 2023-08-13 NOTE — Progress Notes (Signed)
Doing well. SVE unchanged. Cytotec #3

## 2023-08-13 NOTE — Transfer of Care (Signed)
Immediate Anesthesia Transfer of Care Note  Patient: Andrea Gaines  Procedure(s) Performed: CESAREAN SECTION  Patient Location: PACU  Anesthesia Type:Spinal  Level of Consciousness: awake, alert , and oriented  Airway & Oxygen Therapy: Patient Spontanous Breathing  Post-op Assessment: Report given to RN and Post -op Vital signs reviewed and stable  Post vital signs: Reviewed and stable  Last Vitals:  Vitals Value Taken Time  BP 122/71 08/13/23 2101  Temp 36.7 C 08/13/23 2101  Pulse 75 08/13/23 2109  Resp 17 08/13/23 2109  SpO2 98 % 08/13/23 2109  Vitals shown include unfiled device data.  Last Pain:  Vitals:   08/13/23 2101  TempSrc: Oral  PainSc:          Complications: No notable events documented.

## 2023-08-13 NOTE — Anesthesia Postprocedure Evaluation (Signed)
Anesthesia Post Note  Patient: Andrea Gaines  Procedure(s) Performed: CESAREAN SECTION     Patient location during evaluation: PACU Anesthesia Type: Spinal Level of consciousness: awake and alert Pain management: pain level controlled Vital Signs Assessment: post-procedure vital signs reviewed and stable Respiratory status: spontaneous breathing, nonlabored ventilation and respiratory function stable Cardiovascular status: blood pressure returned to baseline Postop Assessment: no apparent nausea or vomiting, spinal receding, no headache and no backache Anesthetic complications: no   No notable events documented.  Last Vitals:  Vitals:   08/13/23 2200 08/13/23 2227  BP: 118/60 116/66  Pulse: 69 67  Resp: 17   Temp: 36.8 C 36.8 C  SpO2: 96% 97%    Last Pain:  Vitals:   08/13/23 2227  TempSrc: Oral  PainSc:    Pain Goal:    LLE Motor Response: Non-purposeful movement (08/13/23 2200) LLE Sensation: Tingling (08/13/23 2200) RLE Motor Response: Non-purposeful movement (08/13/23 2200) RLE Sensation: Tingling (08/13/23 2200)     Epidural/Spinal Function Cutaneous sensation: Pins and Needles (08/13/23 2200), Patient able to flex knees: Yes (08/13/23 2200), Patient able to lift hips off bed: No (08/13/23 2200), Back pain beyond tenderness at insertion site: No (08/13/23 2200), Progressively worsening motor and/or sensory loss: No (08/13/23 2200), Bowel and/or bladder incontinence post epidural: No (08/13/23 2200)  Shanda Howells

## 2023-08-13 NOTE — Op Note (Signed)
PROCEDURE DATE: 08/13/23   PREOPERATIVE DIAGNOSIS: non-reassuring FHT   POSTOPERATIVE DIAGNOSIS: The same   PROCEDURE:    Primary Low Transverse Cesarean Section   SURGEON:  Dr. Belva Agee  ASSISTANT: Dr. Wyn Forster  An experienced assistant was required given the standard of surgical care given the complexity of the case.  This assistant was needed for exposure, dissection, suctioning, retraction, instrument exchange, assisting with delivery with administration of fundal pressure, and for overall help during the procedure.    INDICATIONS: This is a 22 yo G1P0 at 90 wga requiring cesarean section secondary to nrFHT.  Induced and got cytotec. Had two prolonged decels and remained 1cm.  Decision made to proceed with LTCS. The risks of cesarean section discussed with the patient included but were not limited to: bleeding which may require transfusion or reoperation; infection which may require antibiotics; injury to bowel, bladder, ureters or other surrounding organs; injury to the fetus; need for additional procedures including hysterectomy in the event of a life-threatening hemorrhage; placental abnormalities wth subsequent pregnancies, incisional problems, thromboembolic phenomenon and other postoperative/anesthesia complications. The patient agreed with the proposed plan, giving informed consent for the procedure.     FINDINGS:  Viable female infant in vertex presentation, APGARs pending,  Weight pending, Amniotic fluid clear,  Intact placenta, three vessel cord.  Grossly normal uterus .   ANESTHESIA:    Epidural ESTIMATED BLOOD LOSS: 499cc SPECIMENS: Placenta for routine COMPLICATIONS: None immediate   PROCEDURE IN DETAIL:  The patient received intravenous antibiotics (2g Ancef) and had sequential compression devices applied to her lower extremities while in the preoperative area.  She was then taken to the operating room where epidural anesthesia was dosed up to surgical level and was  found to be adequate. She was then placed in a dorsal supine position with a leftward tilt, and prepped and draped in a sterile manner.  A foley catheter was placed into her bladder and attached to constant gravity.  After an adequate timeout was performed, a Pfannenstiel skin incision was made with scalpel and carried through to the underlying layer of fascia. The fascia was incised in the midline and this incision was extended bilaterally using the Mayo scissors. Kocher clamps were applied to the superior aspect of the fascial incision and the underlying rectus muscles were dissected off bluntly. A similar process was carried out on the inferior aspect of the facial incision. The rectus muscles were separated in the midline bluntly and the peritoneum was entered bluntly.  A bladder flap was created sharply and developed bluntly. A transverse hysterotomy was made with a scalpel and extended bilaterally bluntly. The bladder blade was then removed. The infant was successfully delivered, and cord was clamped and cut and infant was handed over to awaiting neonatology team. Uterine massage was then administered and the placenta delivered intact with three-vessel cord. Cord gases were taken. The uterus was cleared of clot and debris.  The hysterotomy was closed with 0 vicryl.  A second imbricating suture of 0-vicryl was used to reinforce the incision and aid in hemostasis.The fascia was closed with 0-Vicryl in a running fashion with good restoration of anatomy.  The subcutaneus tissue was irrigated and was reapproximated using three interrupted plain gut stitches.  The skin was closed with 4-0 Vicryl in a subcuticular fashion.  All surgical site and was hemostatic at end of procedure) without any further bleeding on exam.   It's a girl - " Zivah!"    Pt tolerated the procedure well.  All sponge/lap/needle counts were correct  X 2. Pt taken to recovery room in stable condition.     Belva Agee MD

## 2023-08-13 NOTE — H&P (Signed)
Andrea Gaines is a 22 y.o. female presenting for eIOL. History of hereditary spherocytosis and s/p splenectomy without issues since. Followed by hematology. Hx anxiety well controlled off meds. Hx UTI/Kidney stones. Pregnancy otherwise uncomplicated.  OB History     Gravida  1   Para      Term      Preterm      AB      Living         SAB      IAB      Ectopic      Multiple      Live Births             Past Medical History:  Diagnosis Date   Anxiety    Asthma    as child   Gallstones    Hereditary spherocytosis (HCC)    Kidney stones    Pyelonephritis    UTI (urinary tract infection)    Past Surgical History:  Procedure Laterality Date   CHOLECYSTECTOMY     SPLENECTOMY, TOTAL     TONSILLECTOMY     TYMPANOSTOMY TUBE PLACEMENT     Family History: family history includes Aneurysm in her maternal grandmother; Asthma in her brother and sister; Brain cancer in her paternal grandmother; Breast cancer in an other family member; Diabetes in her maternal grandmother; Healthy in her father; Heart disease in her maternal grandfather and maternal grandmother; Hereditary spherocytosis in her mother and sister; Hiatal hernia in her mother; Hypertension in her maternal grandfather, maternal grandmother, and mother; Kidney Stones in her brother; Prostate cancer in her paternal grandfather; Testicular cancer in her maternal uncle. Social History:  reports that she has never smoked. She has never used smokeless tobacco. She reports that she does not currently use alcohol. She reports that she does not use drugs.     Maternal Diabetes: No Genetic Screening: Normal Maternal Ultrasounds/Referrals: Normal Fetal Ultrasounds or other Referrals:  None Maternal Substance Abuse:  No Significant Maternal Medications:  None Significant Maternal Lab Results:  Group B Strep negative Number of Prenatal Visits:greater than 3 verified prenatal visits Maternal Vaccinations:TDap Other  Comments:  None  Review of Systems History Dilation: Fingertip Effacement (%): Thick Station: Ballotable Exam by:: Andrea Colander RN Blood pressure 122/70, pulse 74, temperature 97.8 F (36.6 C), temperature source Oral, resp. rate 16, height 5\' 9"  (1.753 m), weight 89.3 kg. Exam Physical Exam  NAD, A&O NWOB Abd soft, nondistended, gravid  Prenatal labs: ABO, Rh: --/--/A POS (10/01 0155) Antibody: NEG (10/01 0155) Rubella:   RPR: NON REACTIVE (10/01 0155)  HBsAg:    HIV:    GBS: Negative/-- (09/05 0000)   Assessment/Plan: 22 yo G1P0 @ 39wga presenting for IOL s/s eIOL. Cervix unfavorable.  S/p cytotec x 2. Pitocin/AROM when more favorable.  GBS negative.    Andrea Gaines 08/13/2023, 10:00 AM

## 2023-08-14 ENCOUNTER — Encounter (HOSPITAL_COMMUNITY): Payer: Self-pay | Admitting: Obstetrics and Gynecology

## 2023-08-14 LAB — CBC
HCT: 33.3 % — ABNORMAL LOW (ref 36.0–46.0)
Hemoglobin: 11.7 g/dL — ABNORMAL LOW (ref 12.0–15.0)
MCH: 31.3 pg (ref 26.0–34.0)
MCHC: 35.1 g/dL (ref 30.0–36.0)
MCV: 89 fL (ref 80.0–100.0)
Platelets: 582 10*3/uL — ABNORMAL HIGH (ref 150–400)
RBC: 3.74 MIL/uL — ABNORMAL LOW (ref 3.87–5.11)
RDW: 12.9 % (ref 11.5–15.5)
WBC: 25.8 10*3/uL — ABNORMAL HIGH (ref 4.0–10.5)
nRBC: 0.1 % (ref 0.0–0.2)

## 2023-08-14 NOTE — Progress Notes (Signed)
Subjective: Postpartum Day 1: Cesarean Delivery Patient reports tolerating PO.    Objective: Vital signs in last 24 hours: Temp:  [97.8 F (36.6 C)-98.4 F (36.9 C)] 98.1 F (36.7 C) (10/02 0252) Pulse Rate:  [60-84] 71 (10/02 0435) Resp:  [16-23] 19 (10/02 0435) BP: (98-127)/(45-72) 112/54 (10/02 0435) SpO2:  [96 %-98 %] 98 % (10/01 2335)  Physical Exam:  General: alert, cooperative, and no distress Lochia: appropriate Uterine Fundus: firm Incision: healing well DVT Evaluation: No evidence of DVT seen on physical exam.  Recent Labs    08/13/23 0155 08/14/23 0419  HGB 12.1 11.7*  HCT 34.8* 33.3*    Assessment/Plan: Status post Cesarean section. Doing well postoperatively.  Continue current care.  Roselle Locus II, MD 08/14/2023, 7:09 AM

## 2023-08-14 NOTE — Lactation Note (Signed)
This note was copied from a baby's chart. Lactation Consultation Note  Patient Name: Andrea Gaines [id number] Date: 08/14/2023 Age:20 hours Reason for consult: Follow-up assessment;Primapara;1st time breastfeeding;Term  P1- MOB states that infant nurses well for roughly 15-20 min, then pulls herself off contently. MOB denies any pain or discomfort when infant is nursing. MOB denies any questions or concerns at this moment. LC encouraged MOB to feed infant on cue 8-12x in 24 hrs and to call [facility] services if she needs any assistance. MOB states that she will call LC if she has any problems, but right now she is good.  Maternal Data Does the patient have breastfeeding experience prior to this delivery?: No  Feeding Mother's Current Feeding Choice: Breast Milk  Lactation Tools Discussed/Used Pump Education: Milk Storage  Interventions Interventions: Breast feeding basics reviewed;Education;LC Services brochure  Discharge Discharge Education: Engorgement and breast care;Warning signs for feeding baby  Consult Status Consult Status: PRN Date: 08/14/23 Follow-up type: In-patient    Dema Severin BS, IBCLC 08/14/2023, 12:54 PM

## 2023-08-14 NOTE — Lactation Note (Signed)
This note was copied from a baby's chart. Lactation Consultation Note  Patient Name: Andrea Gaines [id number] Date: 08/14/2023 Age:20 hours Reason for consult: Follow-up assessment;Mother's request;Primapara;1st time breastfeeding;Term;Nipple pain/trauma  P1- MOB requested LC to visit room to discuss MOB's nipple breakdown. When LC entered room, MOB was pumping her left breast. MOB showed LC her right breast and noted a blood blister in the center of the right nipple. LC informed MOB that this is most likely due to a shallow latch. LC reviewed how to latch infant deep enough onto the breast. MOB was provided coconut oil by RN and has been using it to help the healing.  MOB had a large group of visitors in the room, so LC encouraged MOB to call out for a latch consult when infant and MOB are ready.  Maternal Data Does the patient have breastfeeding experience prior to this delivery?: No  Feeding Mother's Current Feeding Choice: Breast Milk  LATCH Score Latch: Repeated attempts needed to sustain latch, nipple held in mouth throughout feeding, stimulation needed to elicit sucking reflex.  Audible Swallowing: A few with stimulation  Type of Nipple: Everted at rest and after stimulation  Comfort (Breast/Nipple): Soft / non-tender  Hold (Positioning): No assistance needed to correctly position infant at breast.  LATCH Score: 8   Lactation Tools Discussed/Used Pump Education: Milk Storage  Interventions Interventions: Breast feeding basics reviewed;Education  Discharge Discharge Education: Engorgement and breast care;Warning signs for feeding baby Pump: DEBP;Stork Pump  Consult Status Consult Status: Follow-up Date: 08/15/23 Follow-up type: In-patient    Andrea Gaines BS, IBCLC 08/14/2023, 6:10 PM

## 2023-08-15 MED ORDER — ONDANSETRON HCL 4 MG PO TABS
4.0000 mg | ORAL_TABLET | Freq: Once | ORAL | Status: AC
  Administered 2023-08-15: 4 mg via ORAL
  Filled 2023-08-15: qty 1

## 2023-08-15 NOTE — Discharge Summary (Signed)
Postpartum Discharge Summary  Date of Service August 15, 2023     Patient Name: Andrea Gaines DOB: 31-Oct-2001 MRN: [id number]  Date of admission: 08/13/2023 Delivery date:08/13/2023 Delivering provider: Ranae Pila Date of discharge: 08/15/2023  Admitting diagnosis: Pregnancy [Z34.90] Intrauterine pregnancy: [redacted]w[redacted]d     Secondary diagnosis:  Principal Problem:   Pregnancy  Additional problems: none    Discharge diagnosis: Term Pregnancy Delivered                                              Post partum procedures: not applicable Augmentation: AROM, Pitocin, and Cytotec Complications: None  Hospital course: Induction of Labor With Cesarean Section   22 y.o. yo G1P1001 at [redacted]w[redacted]d was admitted to the hospital 08/13/2023 for induction of labor. Patient had a labor course significant for fetal decelerations. The patient went for cesarean section due to Non-Reassuring FHR. Delivery details are as follows: Membrane Rupture Time/Date: 8:12 PM,08/13/2023  Delivery Method:C-Section, Low Transverse Operative Delivery:N/A Details of operation can be found in separate operative Note.  Patient had a postpartum course complicated bynothing. She is ambulating, tolerating a regular diet, passing flatus, and urinating well.  Patient is discharged home in stable condition on 08/15/23.      Newborn Data: Birth date:08/13/2023 Birth time:8:12 PM Gender:Female Living status:Living Apgars:8 ,9  Weight:3150 g                               Magnesium Sulfate received: No BMZ received: No Rhophylac:N/A MMR:N/A T-DaP:Given prenatally Flu: N/A RSV Vaccine received: No Transfusion:No Immunizations administered:  There is no immunization history on file for this patient.  Physical exam  Vitals:   08/14/23 0930 08/14/23 1414 08/14/23 1900 08/15/23 0611  BP: 113/61 112/66 117/75 118/65  Pulse: 67 (!) 50 (!) 59 63  Resp: 20 20 18 18   Temp: 98.7 F (37.1 C) 97.9 F (36.6 C) 98.2 F  (36.8 C) 98 F (36.7 C)  TempSrc: Oral Oral Oral Oral  SpO2: 95%  98%   Weight:      Height:       General: alert, cooperative, and no distress Lochia: appropriate Uterine Fundus: firm Incision: Healing well with no significant drainage DVT Evaluation: No evidence of DVT seen on physical exam. Labs: Lab Results  Component Value Date   WBC 25.8 (H) 08/14/2023   HGB 11.7 (L) 08/14/2023   HCT 33.3 (L) 08/14/2023   MCV 89.0 08/14/2023   PLT 582 (H) 08/14/2023      Latest Ref Rng & Units 04/28/2023    7:06 PM  CMP  Glucose 70 - 99 mg/dL 86   BUN 6 - 20 mg/dL 7   Creatinine 1.32 - 4.40 mg/dL 1.02   Sodium 725 - 366 mmol/L 136   Potassium 3.5 - 5.1 mmol/L 3.3   Chloride 98 - 111 mmol/L 105   CO2 22 - 32 mmol/L 19   Calcium 8.9 - 10.3 mg/dL 8.9   Total Protein 6.5 - 8.1 g/dL 5.8   Total Bilirubin 0.3 - 1.2 mg/dL 1.6   Alkaline Phos 38 - 126 U/L 62   AST 15 - 41 U/L 17   ALT 0 - 44 U/L 15    Edinburgh Score:    08/13/2023   10:55 PM  Edinburgh Postnatal Depression  Scale Screening Tool  I have been able to laugh and see the funny side of things. 0  I have looked forward with enjoyment to things. 0  I have blamed myself unnecessarily when things went wrong. 0  I have been anxious or worried for no good reason. 0  I have felt scared or panicky for no good reason. 0  Things have been getting on top of me. 0  I have been so unhappy that I have had difficulty sleeping. 0  I have felt sad or miserable. 0  I have been so unhappy that I have been crying. 0  The thought of harming myself has occurred to me. 0  Edinburgh Postnatal Depression Scale Total 0      After visit meds:  Allergies as of 08/15/2023       Reactions   Morphine Palpitations   Made Hot and pains in back   Augmentin [amoxicillin-pot Clavulanate] Diarrhea        Medication List     STOP taking these medications    cyclobenzaprine 10 MG tablet Commonly known as: FLEXERIL       TAKE these  medications    acetaminophen 325 MG tablet Commonly known as: TYLENOL Take 2 tablets (650 mg total) by mouth every 6 (six) hours as needed for mild pain, fever or headache.   polyethylene glycol 17 g packet Commonly known as: MIRALAX / GLYCOLAX Take 17 g by mouth daily as needed for mild constipation.   prenatal vitamin w/FE, FA 29-1 MG Chew chewable tablet Chew 1 tablet by mouth daily at 12 noon. gummies         Discharge home in stable condition Infant Feeding: Breast Infant Disposition:home with mother Discharge instruction: per After Visit Summary and Postpartum booklet. Activity: Advance as tolerated. Pelvic rest for 6 weeks.  Diet: routine diet Anticipated Birth Control: Unsure Postpartum Appointment:6 weeks Additional Postpartum F/U: Incision check 1 week Future Appointments:No future appointments. Follow up Visit:      08/15/2023 Jeani Hawking, MD

## 2023-08-22 ENCOUNTER — Inpatient Hospital Stay (HOSPITAL_COMMUNITY): Payer: 59

## 2023-09-09 ENCOUNTER — Telehealth (HOSPITAL_COMMUNITY): Payer: Self-pay | Admitting: *Deleted

## 2023-09-09 NOTE — Telephone Encounter (Signed)
09/09/2023  Name: Susane Hargreaves MRN: [id number] DOB: [birth year]  Reason for Call:  Transition of Care Hospital Discharge Call  Contact Status: Patient Contact Status: Message  Language assistant needed:          Follow-Up Questions:    Inocente Salles Postnatal Depression Scale:  In the Past 7 Days:    PHQ2-9 Depression Scale:     Discharge Follow-up:    Post-discharge interventions: NA  Salena Saner, RN 09/09/2023 17:05

## 2024-07-25 ENCOUNTER — Other Ambulatory Visit: Payer: Self-pay

## 2024-07-25 ENCOUNTER — Emergency Department (HOSPITAL_COMMUNITY)

## 2024-07-25 ENCOUNTER — Encounter (HOSPITAL_COMMUNITY): Payer: Self-pay | Admitting: *Deleted

## 2024-07-25 ENCOUNTER — Emergency Department (HOSPITAL_COMMUNITY)
Admission: EM | Admit: 2024-07-25 | Discharge: 2024-07-26 | Disposition: A | Discharge status: Home / Self Care | Attending: Emergency Medicine | Admitting: Emergency Medicine

## 2024-07-25 DIAGNOSIS — S0083XA Contusion of other part of head, initial encounter: Secondary | ICD-10-CM | POA: Diagnosis not present

## 2024-07-25 DIAGNOSIS — Y9364 Activity, baseball: Secondary | ICD-10-CM | POA: Diagnosis not present

## 2024-07-25 DIAGNOSIS — W2107XA Struck by softball, initial encounter: Secondary | ICD-10-CM | POA: Diagnosis not present

## 2024-07-25 DIAGNOSIS — S0993XA Unspecified injury of face, initial encounter: Secondary | ICD-10-CM | POA: Diagnosis present

## 2024-07-25 NOTE — ED Provider Triage Note (Signed)
 Emergency Medicine Provider Triage Evaluation Note  Andrea Gaines , a 23 y.o. female  was evaluated in triage.  Pt complains of face injury. Playing in softball game, ball was hit by the bat, bounced off the ground and into patient's face hitting right orbital area. No LOC. Wears contacts, slightly blurry vision right eye, infraorbital bruising and pain.  Asplenic, not on thinners  Review of Systems  Positive:  Negative:   Physical Exam  BP 120/75 (BP Location: Right Arm)   Temp 98.3 F (36.8 C)   Resp (!) 21   Ht 5' 9 (1.753 m)   Wt 89.3 kg   BMI 29.07 kg/m  Gen:   Awake, no distress   Resp:  Normal effort  MSK:   Moves extremities without difficulty  Other:  EOMI, pupils round and reactive. Ecchymosis to infraorbital area with TTP  Medical Decision Making  Medically screening exam initiated at 10:12 PM.  Appropriate orders placed.  Andrea Gaines was informed that the remainder of the evaluation will be completed by another provider, this initial triage assessment does not replace that evaluation, and the importance of remaining in the ED until their evaluation is complete.     Beverley Leita LABOR, PA-C 07/25/24 2214

## 2024-07-25 NOTE — ED Triage Notes (Signed)
 The pt was given  excedrin migraine by her friend

## 2024-07-25 NOTE — ED Triage Notes (Signed)
 The pt was struck in the rt eye with a batted softball approx 3 hours ago  no loss of consciousness  lmp iud

## 2024-07-26 DIAGNOSIS — S0083XA Contusion of other part of head, initial encounter: Secondary | ICD-10-CM | POA: Diagnosis not present

## 2024-07-26 MED ORDER — KETOROLAC TROMETHAMINE 15 MG/ML IJ SOLN
15.0000 mg | Freq: Once | INTRAMUSCULAR | Status: AC
  Administered 2024-07-26: 15 mg via INTRAMUSCULAR
  Filled 2024-07-26: qty 1

## 2024-07-26 MED ORDER — DEXAMETHASONE SODIUM PHOSPHATE 10 MG/ML IJ SOLN
10.0000 mg | Freq: Once | INTRAMUSCULAR | Status: AC
  Administered 2024-07-26: 10 mg via INTRAMUSCULAR
  Filled 2024-07-26: qty 1

## 2024-07-26 NOTE — Discharge Instructions (Signed)
 Recommend ice for 20 minutes at a time. Follow up with your eye doctor on Monday for recheck. Return to the ER for any concerning symptoms.

## 2024-07-26 NOTE — ED Provider Notes (Signed)
 [place] EMERGENCY DEPARTMENT AT [facility] Provider Note   CSN: [id number] Arrival date & time: 07/25/24  2056     Patient presents with: Eye Injury   Andrea Gaines is a 23 y.o. female.  {Add pertinent medical, surgical, social history, OB history to HPI:32947} 23 y.o. female  was evaluated in triage.  Pt complains of face injury. Playing in softball game, ball was hit by the bat, bounced off the ground and into patient's face hitting right orbital area. No LOC. Wears contacts, slightly blurry vision right eye, infraorbital bruising and pain.  Asplenic, not on thinners         Prior to Admission medications   Medication Sig Start Date End Date Taking? Authorizing Provider  acetaminophen  (TYLENOL ) 325 MG tablet Take 2 tablets (650 mg total) by mouth every 6 (six) hours as needed for mild pain, fever or headache. 05/12/22   Sheikh, Omair Latif, DO  polyethylene glycol (MIRALAX  / GLYCOLAX ) 17 g packet Take 17 g by mouth daily as needed for mild constipation. 05/12/22   Sherrill Alejandro Donovan, DO  prenatal vitamin w/FE, FA (NATACHEW) 29-1 MG CHEW chewable tablet Chew 1 tablet by mouth daily at 12 noon. gummies    [provider]    Allergies: Morphine  and Augmentin [amoxicillin-pot clavulanate]    Review of Systems Negative except as per HPI Updated Vital Signs BP 120/75 (BP Location: Right Arm)   Temp 98.3 F (36.8 C)   Resp (!) 21   Ht 5' 9 (1.753 m)   Wt 89.3 kg   BMI 29.07 kg/m   Physical Exam  (all labs ordered are listed, but only abnormal results are displayed) Labs Reviewed - No data to display  EKG: None  Radiology: CT Orbits Wo Contrast Result Date: 07/25/2024 CLINICAL DATA:  Initial evaluation for acute trauma. EXAM: CT ORBITS WITHOUT CONTRAST TECHNIQUE: Multidetector CT imaging of the orbits was performed using the standard protocol without intravenous contrast. Multiplanar CT image reconstructions were also generated. RADIATION DOSE  REDUCTION: This exam was performed according to the departmental dose-optimization program which includes automated exposure control, adjustment of the mA and/or kV according to patient size and/or use of iterative reconstruction technique. COMPARISON:  None Available. FINDINGS: Orbits: Globes are symmetric in size with normal appearance and morphology. No traumatic globe rupture. Intraconal and extraconal fat maintained with no retro-orbital hematoma. Extra-ocular muscles symmetric and normal. Optic nerves within normal limits. Lacrimal glands normal. Visible paranasal sinuses: Visualized paranasal sinuses are largely clear. Visualized mastoids and middle ear cavities are clear as well. Soft tissues: Soft tissue swelling and contusion seen involving the right periorbital/infraorbital soft tissues. Osseous: No acute osseous abnormality.  Bony orbits intact. Limited intracranial: Unremarkable. IMPRESSION: Soft tissue swelling and contusion involving the right periorbital/infraorbital soft tissues. No acute osseous abnormality. Intact globes with no retro-orbital hematoma. Electronically Signed   By: Morene Hoard M.D.   On: 07/25/2024 23:14    {Document cardiac monitor, telemetry assessment procedure when appropriate:32947} Procedures   Medications Ordered in the ED  ketorolac  (TORADOL ) 15 MG/ML injection 15 mg (has no administration in time range)  dexamethasone  (DECADRON ) injection 10 mg (has no administration in time range)      {Click here for ABCD2, HEART and other calculators REFRESH Note before signing:1}                              Medical Decision Making Amount and/or Complexity of  Data Reviewed Radiology: ordered.  Risk Prescription drug management.   ***  {Document critical care time when appropriate  Document review of labs and clinical decision tools ie CHADS2VASC2, etc  Document your independent review of radiology images and any outside records  Document your  discussion with family members, caretakers and with consultants  Document social determinants of health affecting pt's care  Document your decision making why or why not admission, treatments were needed:32947:::1}   Final diagnoses:  Contusion of face, initial encounter    ED Discharge Orders     None

## 2024-07-26 NOTE — ED Notes (Signed)
 ..  The patient is A&OX4, ambulatory at d/c with independent steady gait, NAD. Pt verbalized understanding of d/c instructions and follow up care.

## 2024-10-16 ENCOUNTER — Other Ambulatory Visit: Payer: Self-pay | Admitting: Family Medicine

## 2024-10-16 DIAGNOSIS — R29898 Other symptoms and signs involving the musculoskeletal system: Secondary | ICD-10-CM

## 2024-10-16 DIAGNOSIS — G8929 Other chronic pain: Secondary | ICD-10-CM

## 2024-11-09 ENCOUNTER — Other Ambulatory Visit

## 2024-12-03 ENCOUNTER — Other Ambulatory Visit

## 2024-12-21 ENCOUNTER — Other Ambulatory Visit
